# Patient Record
Sex: Male | Born: 1965 | Race: White | Hispanic: No | Marital: Married | Smoking: Former smoker
Health system: Southern US, Community
[De-identification: ages and names within clinical notes are randomized; demographics above are authoritative.]

## PROBLEM LIST (undated history)

## (undated) DIAGNOSIS — G4730 Sleep apnea, unspecified: Secondary | ICD-10-CM

## (undated) DIAGNOSIS — E119 Type 2 diabetes mellitus without complications: Secondary | ICD-10-CM

## (undated) DIAGNOSIS — E559 Vitamin D deficiency, unspecified: Secondary | ICD-10-CM

## (undated) DIAGNOSIS — M1990 Unspecified osteoarthritis, unspecified site: Secondary | ICD-10-CM

## (undated) DIAGNOSIS — K219 Gastro-esophageal reflux disease without esophagitis: Secondary | ICD-10-CM

## (undated) HISTORY — DX: Vitamin D deficiency, unspecified: E55.9

## (undated) HISTORY — DX: Unspecified osteoarthritis, unspecified site: M19.90

## (undated) HISTORY — PX: ELBOW SURGERY: SHX618

## (undated) HISTORY — PX: HERNIA REPAIR: SHX51

## (undated) HISTORY — DX: Gastro-esophageal reflux disease without esophagitis: K21.9

## (undated) HISTORY — PX: SPINE SURGERY: SHX786

## (undated) HISTORY — PX: FRACTURE SURGERY: SHX138

---

## 1999-02-14 ENCOUNTER — Ambulatory Visit: Admission: RE | Admit: 1999-02-14 | Discharge: 1999-02-14 | Payer: Self-pay | Admitting: *Deleted

## 1999-02-14 ENCOUNTER — Encounter: Payer: Self-pay | Admitting: *Deleted

## 2012-04-27 ENCOUNTER — Emergency Department
Admit: 2012-04-27 | Discharge: 2012-04-27 | Disposition: A | Discharge status: Home / Self Care | Payer: BC Managed Care – PPO

## 2012-04-27 ENCOUNTER — Emergency Department
Admission: EM | Admit: 2012-04-27 | Discharge: 2012-04-27 | Disposition: A | Discharge status: Home / Self Care | Payer: BC Managed Care – PPO | Source: Home / Self Care

## 2012-04-27 DIAGNOSIS — M533 Sacrococcygeal disorders, not elsewhere classified: Secondary | ICD-10-CM

## 2012-04-27 LAB — POCT URINALYSIS DIP (MANUAL ENTRY)
Bilirubin, UA: NEGATIVE
Blood, UA: NEGATIVE
Glucose, UA: NEGATIVE
Ketones, POC UA: NEGATIVE
Leukocytes, UA: NEGATIVE
Nitrite, UA: NEGATIVE
Protein Ur, POC: NEGATIVE
Spec Grav, UA: 1.025 (ref 1.005–1.03)
Urobilinogen, UA: 0.2 (ref 0–1)
pH, UA: 7 (ref 5–8)

## 2012-04-27 MED ORDER — MELOXICAM 15 MG PO TABS: 15.0000 mg | ORAL_TABLET | Freq: Every day | ORAL | Status: DC

## 2012-04-27 NOTE — ED Notes (Addendum)
Patient complains of lower back pain for 4 weeks. Bobby Saunders states the pain is worse when he is walking and sometimes the pain radiates down his legs. He describes the pain as severe shooting pain that is a 6/10 on the pain scale. Bobby Saunders also states when his bladder is full he feels more pain in his lower back.

## 2012-04-27 NOTE — ED Provider Notes (Signed)
History     CSN: [id number]  Arrival date & time 04/27/12  1238   None     Chief Complaint  Patient presents with  . Back Pain    4 weeks      HPI Comments: Patient complains of one month history of intermittent recurring bilateral low back pain.  He points to two areas that are over the SI joints.  The pain does not radiate, although he experiences a vague weakness in the knees when the pain occurs.  The pain is not constant, and when it occurs it is a brief sharp "catch" in his lower back.  The pain is worse when standing and walking, and relieved by sitting/supine.  Over the past 3 days the pain has been somewhat worse.  No bowel or bladder dysfunction.  No saddle numbness.  He notes that with any valsalva maneuver there is discomfort in the lower back.  He feels well otherwise.  No fevers, chills, and sweats  He recalls no injury.  He works for [employer] and is constantly walking and carrying packages.  No recent change in work activities.  He admits that he has gained about 50 pounds over the past several years.  Patient is a 46 y.o. male presenting with back pain. The history is provided by the patient.  Back Pain  This is a new problem. Episode onset: 1 month ago. Episode frequency: intermittently. The problem has been rapidly worsening. The pain is associated with no known injury. The pain is present in the sacro-iliac joint. The quality of the pain is described as stabbing. The pain radiates to the right knee and left knee. The pain is at a severity of 6/10. The pain is moderate. Exacerbated by: standing. The pain is worse during the day. Pertinent negatives include no fever, no numbness, no weight loss, no abdominal pain, no bowel incontinence, no perianal numbness, no bladder incontinence, no dysuria, no pelvic pain, no leg pain, no paresthesias, no paresis, no tingling and no weakness. He has tried NSAIDs for the symptoms. The treatment provided mild relief. Risk factors include obesity.     History reviewed. No pertinent past medical history.  Past Surgical History  Procedure Date  . Elbow surgery     Family History  Problem Relation Age of Onset  . Cancer Sister     colon    History  Substance Use Topics  . Smoking status: Former Smoker -- 1.0 packs/day for 25 years  . Smokeless tobacco: Current User  . Alcohol Use: No      Review of Systems  Constitutional: Negative for fever and weight loss.  Gastrointestinal: Negative for abdominal pain and bowel incontinence.  Genitourinary: Negative for bladder incontinence, dysuria and pelvic pain.  Musculoskeletal: Positive for back pain.  Neurological: Negative for tingling, weakness, numbness and paresthesias.  All other systems reviewed and are negative.    Allergies  Review of patient's allergies indicates no known allergies.  Home Medications   Current Outpatient Rx  Name Route Sig Dispense Refill  . MELOXICAM 15 MG PO TABS Oral Take 1 tablet (15 mg total) by mouth daily. Take with food each morning 15 tablet 1    BP 118/74  Pulse 60  Temp(Src) 98.3 F (36.8 C) (Oral)  Resp 18  Ht 5\' 10"  (1.778 m)  Wt 277 lb (125.646 kg)  BMI 39.75 kg/m2  SpO2 95%  Physical Exam Nursing notes and Vital Signs reviewed. Appearance:  Patient appears stated age, and in no  acute distress.  Patient is obese (BMI 39.8) Eyes:  Pupils are equal, round, and reactive to light and accomodation.  Extraocular movement is intact.  Conjunctivae are not inflamed  Neck:  full range of motion   Lungs:  Clear to auscultation.  Breath sounds are equal.  Heart:  Regular rate and rhythm without murmurs, rubs, or gallops.  Abdomen:  Nontender without masses or hepatosplenomegaly.  Bowel sounds are present.  No CVA or flank tenderness.  Extremities:  No edema.  No calf tenderness.  No tenderness over greater trochanters.  No leg length discrepancy Skin:  No rash present.   Back:  Full range of motion.  Can heel/toe walk and squat  without difficulty.  No tenderness to palpation.   Straight leg raising test is negative.  Sitting knee extension test is negative.  Strength and sensation in the lower extremities is normal.  Patellar and achilles reflexes are normal.  Areas of pain are located over SI joints but no tenderness there.    ED Course  Procedures  none  Labs Reviewed - Urinalysis (dipstick): negative Dg Lumbar Spine 2-3 Views  04/27/2012  *RADIOLOGY REPORT*  Clinical Data: Lower back pain for 1 month.  Pain over the SI joints.  No known injury.  LUMBAR SPINE - 2-3 VIEW  Comparison: None.  Findings: There are five non-rib bearing vertebral bodies.  There is normal alignment.  There is no evidence for acute fracture or subluxation.   No significant degenerative changes identified. The visualized portion of the pelvis has a normal appearance. Visualized bowel gas pattern is nonobstructive.  IMPRESSION: Negative exam.  Original Report Authenticated By: Patterson Hammersmith, M.D.     1. Pain of both sacroiliac joints, probably exacerbated by weight gain      MDM  Begin Mobic 15mg  QAM pc.  Begin applying ice pack 2 or 3 times daily.  Begin stretching and range of motion exercises ([facility] information and instruction handout given)  Discussed long term goal of gradual weight loss. Followup with [facility] if not improving about two weeks.         Lattie Haw, MD 04/27/12 1425

## 2012-04-27 NOTE — Discharge Instructions (Signed)
Apply ice pack two or three times daily.  Begin back exercises.  Sacroiliac Joint Dysfunction The sacroiliac joint connects the lower part of the spine (the sacrum) with the bones of the pelvis. CAUSES  Sometimes, there is no obvious reason for sacroiliac joint dysfunction. Other times, it may occur   During pregnancy.   After injury, such as:   Car accidents.   Sport-related injuries.   Work-related injuries.   Due to one leg being shorter than the other.   Due to other conditions that affect the joints, such as:   Rheumatoid arthritis.   Gout.   Psoriasis.   Joint infection (septic arthritis).  SYMPTOMS  Symptoms may include:  Pain in the:   Lower back.   Buttocks.   Groin.   Thighs and legs.   Difficult sitting, standing, walking, lying, bending or lifting.  DIAGNOSIS  A number of tests may be used to help diagnose the cause of sacroiliac joint dysfunction, including:  Imaging tests to look for other causes of pain, including:   MRI.   CT scan.   Bone scan.   Diagnostic injection: During a special x-ray (called fluoroscopy), a needle is put into the sacroiliac joint. A numbing medicine is injected into the joint. If the pain is improved or stopped, the diagnosis of sacroiliac joint dysfunction is more likely.  TREATMENT  There are a number of types of treatment used for sacroiliac joint dysfunction, including:  Only take over-the-counter or prescription medicines for pain, discomfort, or fever as directed by your caregiver.   Medications to relax muscles.   Rest. Decreasing activity can help cut down on painful muscle spasms and allow the back to heal.   Application of heat or ice to the lower back may improve muscle spasms and soothe pain.   Brace. A special back brace, called a sacroiliac belt, can help support the joint while your back is healing.   Physical therapy can help teach comfortable positions and exercises to strengthen muscles that  support the sacroiliac joint.   Cortisone injections. Injections of steroid medicine into the joint can help decrease swelling and improve pain.   Hyaluronic acid injections. This chemical improves lubrication within the sacroiliac joint, thereby decreasing pain.   Radiofrequency ablation. A special needle is placed into the joint, where it burns away nerves that are carrying pain messages from the joint.   Surgery. Because pain occurs during movement of the joint, screws and plates may be installed in order to limit or prevent joint motion.  HOME CARE INSTRUCTIONS   Take all medications exactly as directed.   Follow instructions regarding both rest and physical activity, to avoid worsening the pain.   Do physical therapy exercises exactly as prescribed.  SEEK IMMEDIATE MEDICAL CARE IF:  You experience increasingly severe pain.   You develop new symptoms, such as numbness or tingling in your legs or feet.   You lose bladder or bowel control.  Document Released: 02/15/2009 Document Revised: 11/08/2011 Document Reviewed: 02/15/2009 [facility] Patient Information 2012 [place], [place].

## 2012-05-30 ENCOUNTER — Encounter: Payer: Self-pay | Admitting: Family Medicine

## 2012-05-30 ENCOUNTER — Ambulatory Visit (INDEPENDENT_AMBULATORY_CARE_PROVIDER_SITE_OTHER): Payer: BC Managed Care – PPO | Admitting: Family Medicine

## 2012-05-30 VITALS — BP 125/81 | HR 58 | Temp 98.1°F | Ht 70.0 in | Wt 270.0 lb

## 2012-05-30 DIAGNOSIS — M545 Low back pain, unspecified: Secondary | ICD-10-CM

## 2012-05-30 DIAGNOSIS — M79604 Pain in right leg: Secondary | ICD-10-CM

## 2012-05-30 MED ORDER — PREDNISONE (PAK) 10 MG PO TABS: ORAL_TABLET | ORAL | Status: DC

## 2012-05-30 MED ORDER — MELOXICAM 15 MG PO TABS: 15.0000 mg | ORAL_TABLET | Freq: Every day | ORAL | Status: DC

## 2012-05-30 NOTE — Patient Instructions (Addendum)
I'm concerned you have spinal stenosis as a result of disc herniation/bulging. Take tylenol for baseline pain relief (1-2 extra strength tabs 3x/day) Start prednisone as directed.   Stop meloxicam for now but restart this the day after you finish prednisone. Consider Tramadol or vicodin as needed for severe pain (no driving on this medicine). Consider Flexeril as needed for muscle spasms (no driving on this medicine). Stay as active as possible. Physical therapy has been shown to be helpful as well - start this in [place]. Strengthening of low back muscles, abdominal musculature are key for long term pain relief in most cases. If not improving, will consider further imaging (MRI). Follow up with me in 2 weeks if you are not improving - otherwise follow-up with me in 6 weeks.

## 2012-06-02 ENCOUNTER — Encounter: Payer: Self-pay | Admitting: Family Medicine

## 2012-06-02 DIAGNOSIS — M545 Low back pain, unspecified: Secondary | ICD-10-CM | POA: Insufficient documentation

## 2012-06-02 NOTE — Assessment & Plan Note (Signed)
Patient's description of pain in low back radiating into legs, improved with leaning forward is suggestive of spinal stenosis likely from disc bulge/herniation given lumbar radiographs appear normal.  Will try to treat conservative - start prednisone and transition back to meloxicam.  Tylenol as needed in addition to this. Start PT as well.  F/u in 2 weeks if not improving - would consider MRI at that time.  Otherwise f/u in 6 weeks.

## 2012-06-02 NOTE — Progress Notes (Addendum)
  Subjective:    Patient ID: Bobby Saunders, male    DOB: 08-25-1966, 46 y.o.   MRN: [id number]  PCP: Albertina Parr  HPI 46 yo M here for low back, leg pain.  Patient denies known injury. States about 2 months ago he started to have low back pain that was worse with standing still, by end of day, and at rest. Improved leaning forward and moving and sitting. Tingling goes into both feet at times. Had x-rays done which were negative. No bowel or bladder dysfunction, no leg weakness. No prior back issues. No FH DM, RA. Taking meloxicam. 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 Pain shoots when walking down to posterior knees.  History reviewed. No pertinent past medical history.  No current outpatient prescriptions on file prior to visit.    Past Surgical History  Procedure Date  . Elbow surgery     No Known Allergies  History   Social History  . Marital Status: Married    Spouse Name: N/A    Number of Children: N/A  . Years of Education: N/A   Occupational History  . Not on file.   Social History Main Topics  . Smoking status: Former Smoker -- 1.0 packs/day for 25 years  . Smokeless tobacco: Current User  . Alcohol Use: No  . Drug Use: No  . Sexually Active: Not on file   Other Topics Concern  . Not on file   Social History Narrative  . No narrative on file    Family History  Problem Relation Age of Onset  . Cancer Sister     colon  . Sudden death Neg Hx   . Hypertension Neg Hx   . Hyperlipidemia Neg Hx   . Heart attack Neg Hx   . Diabetes Neg Hx     BP 125/81  Pulse 58  Temp 98.1 F (36.7 C) (Oral)  Ht 5\' 10"  (1.778 m)  Wt 270 lb (122.471 kg)  BMI 38.74 kg/m2  Review of Systems See HPI above.    Objective:   Physical Exam Gen: NAD  Back: No gross deformity, scoliosis. TTP mildly bilateral paraspinal regions lumbar spine and into buttocks.  No midline or bony TTP. FROM with pain on flexion but not extension. Strength LEs 5/5  all muscle groups.   2+ MSRs in patellar and achilles tendons, equal bilaterally. Negative SLRs - pulling in hamstring area only. Sensation intact to light touch bilaterally. Negative logroll bilateral hips Negative fabers and piriformis stretches.     Assessment & Plan:  1. Low back pain - Patient's description of pain in low back radiating into legs, improved with leaning forward is suggestive of spinal stenosis likely from disc bulge/herniation given lumbar radiographs appear normal.  Will try to treat conservative - start prednisone and transition back to meloxicam.  Tylenol as needed in addition to this. Start PT as well.  F/u in 2 weeks if not improving - would consider MRI at that time.  Otherwise f/u in 6 weeks.

## 2012-06-06 ENCOUNTER — Ambulatory Visit: Payer: BC Managed Care – PPO | Attending: Family Medicine | Admitting: Physical Therapy

## 2012-06-06 DIAGNOSIS — IMO0001 Reserved for inherently not codable concepts without codable children: Secondary | ICD-10-CM | POA: Insufficient documentation

## 2012-06-06 DIAGNOSIS — M545 Low back pain, unspecified: Secondary | ICD-10-CM | POA: Insufficient documentation

## 2012-06-06 DIAGNOSIS — M25569 Pain in unspecified knee: Secondary | ICD-10-CM | POA: Insufficient documentation

## 2012-06-10 ENCOUNTER — Encounter: Payer: BC Managed Care – PPO | Admitting: Physical Therapy

## 2012-06-13 ENCOUNTER — Encounter: Payer: BC Managed Care – PPO | Admitting: Physical Therapy

## 2012-06-17 ENCOUNTER — Ambulatory Visit: Payer: BC Managed Care – PPO | Admitting: Physical Therapy

## 2012-06-17 ENCOUNTER — Encounter: Payer: BC Managed Care – PPO | Admitting: Physical Therapy

## 2012-06-18 ENCOUNTER — Encounter: Payer: BC Managed Care – PPO | Admitting: Physical Therapy

## 2012-06-25 ENCOUNTER — Ambulatory Visit: Payer: BC Managed Care – PPO | Admitting: Physical Therapy

## 2012-07-02 ENCOUNTER — Ambulatory Visit: Payer: BC Managed Care – PPO | Admitting: Physical Therapy

## 2012-07-09 ENCOUNTER — Ambulatory Visit: Payer: BC Managed Care – PPO | Attending: Family Medicine | Admitting: Physical Therapy

## 2012-07-09 DIAGNOSIS — M545 Low back pain, unspecified: Secondary | ICD-10-CM | POA: Insufficient documentation

## 2012-07-09 DIAGNOSIS — IMO0001 Reserved for inherently not codable concepts without codable children: Secondary | ICD-10-CM | POA: Insufficient documentation

## 2012-07-09 DIAGNOSIS — M25569 Pain in unspecified knee: Secondary | ICD-10-CM | POA: Insufficient documentation

## 2012-07-16 ENCOUNTER — Ambulatory Visit: Payer: BC Managed Care – PPO | Admitting: Physical Therapy

## 2012-07-24 ENCOUNTER — Ambulatory Visit: Payer: BC Managed Care – PPO | Admitting: Family Medicine

## 2012-07-28 ENCOUNTER — Ambulatory Visit (INDEPENDENT_AMBULATORY_CARE_PROVIDER_SITE_OTHER): Payer: BC Managed Care – PPO | Admitting: Family Medicine

## 2012-07-28 ENCOUNTER — Encounter: Payer: Self-pay | Admitting: Family Medicine

## 2012-07-28 VITALS — BP 125/80 | HR 57 | Ht 70.0 in | Wt 275.0 lb

## 2012-07-28 DIAGNOSIS — M545 Low back pain, unspecified: Secondary | ICD-10-CM

## 2012-07-28 NOTE — Progress Notes (Signed)
Subjective:    Patient ID: Bobby Saunders, male    DOB: 1966-02-11, 46 y.o.   MRN: [id number]  PCP: Albertina Parr  HPI  46 yo M here for f/u low back, leg pain.  6/28: Patient denies known injury. States about 2 months ago he started to have low back pain that was worse with standing still, by end of day, and at rest. Improved leaning forward and moving and sitting. Tingling goes into both feet at times. Had x-rays done which were negative. No bowel or bladder dysfunction, no leg weakness. No prior back issues. No FH DM, RA. Taking meloxicam. 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 Pain shoots when walking down to posterior knees.  8/26: Patient reports he is about 46% improved from last visit but still has quite a bit of pain. Pain primarily when getting up in morning and at end of day after work - improves while working. Continues to have radiation down both legs posteriorly at times. Taking meloxicam. Finished prednisone - a little improvement with this but seems most improvement has been with PT/HEP. Pain feels like a tazer in low back when this comes on - feels locked at beginning of movements. No other changes.   History reviewed. No pertinent past medical history.  Current Outpatient Prescriptions on File Prior to Visit  Medication Sig Dispense Refill  . meloxicam (MOBIC) 15 MG tablet Take 1 tablet (15 mg total) by mouth daily. Take with food each morning.  Start AFTER finishing prednisone.  30 tablet  1    Past Surgical History  Procedure Date  . Elbow surgery     No Known Allergies  History   Social History  . Marital Status: Married    Spouse Name: N/A    Number of Children: N/A  . Years of Education: N/A   Occupational History  . Not on file.   Social History Main Topics  . Smoking status: Former Smoker -- 1.0 packs/day for 25 years  . Smokeless tobacco: Current User  . Alcohol Use: No  . Drug Use: No  . Sexually Active: Not on file    Other Topics Concern  . Not on file   Social History Narrative  . No narrative on file    Family History  Problem Relation Age of Onset  . Cancer Sister     colon  . Sudden death Neg Hx   . Hypertension Neg Hx   . Hyperlipidemia Neg Hx   . Heart attack Neg Hx   . Diabetes Neg Hx     BP 125/80  Pulse 57  Ht 5\' 10"  (1.778 m)  Wt 275 lb (124.739 kg)  BMI 39.46 kg/m2  Review of Systems  See HPI above.    Objective:   Physical Exam  Gen: NAD  Back: No gross deformity, scoliosis. No TTP bilateral paraspinal regions lumbar spine or buttocks, improved.  No midline or bony TTP. FROM without pain. Strength LEs 5/5 all muscle groups.   2+ MSRs in patellar and achilles tendons, equal bilaterally. Negative SLRs. Sensation intact to light touch bilaterally. Negative logroll bilateral hips Negative fabers and piriformis stretches.    Assessment & Plan:  1. Low back pain - Felt to be due to spinal stenosis, disc bulge/herniation.  Radiographs normal.  Has had some improvement but pain still debilitating at times.  Will go ahead with MRI lumbar spine to further assess.  Consider ESIs, neurosurg referral, continued PT/HEP depending on results.  Addendum 8/28:  MRI confirms moderate spinal stenosis at L3-4 with protrusion on right that could affect L3 or 4 nerve roots on right.  Think most of his symptoms are due to spinal stenosis.  Will refer patient to Dr. Maurice Small to consider injections.  Do not think his findings are severe enough to warrant neurosurgery referral at this time.

## 2012-07-28 NOTE — Assessment & Plan Note (Signed)
Felt to be due to spinal stenosis, disc bulge/herniation.  Radiographs normal.  Has had some improvement but pain still debilitating at times.  Will go ahead with MRI lumbar spine to further assess.  Consider ESIs, neurosurg referral, continued PT/HEP depending on results.

## 2012-07-29 ENCOUNTER — Ambulatory Visit (HOSPITAL_BASED_OUTPATIENT_CLINIC_OR_DEPARTMENT_OTHER)
Admission: RE | Admit: 2012-07-29 | Discharge: 2012-07-29 | Disposition: A | Discharge status: Home / Self Care | Payer: BC Managed Care – PPO | Source: Ambulatory Visit | Attending: Family Medicine | Admitting: Family Medicine

## 2012-07-29 DIAGNOSIS — M5126 Other intervertebral disc displacement, lumbar region: Secondary | ICD-10-CM | POA: Insufficient documentation

## 2012-07-29 DIAGNOSIS — M25559 Pain in unspecified hip: Secondary | ICD-10-CM | POA: Insufficient documentation

## 2012-07-29 DIAGNOSIS — M545 Low back pain, unspecified: Secondary | ICD-10-CM

## 2012-11-03 ENCOUNTER — Encounter: Payer: Self-pay | Admitting: Sports Medicine

## 2012-11-03 ENCOUNTER — Ambulatory Visit (INDEPENDENT_AMBULATORY_CARE_PROVIDER_SITE_OTHER): Payer: BC Managed Care – PPO | Admitting: Sports Medicine

## 2012-11-03 VITALS — BP 138/85 | HR 56 | Wt 278.0 lb

## 2012-11-03 DIAGNOSIS — Z299 Encounter for prophylactic measures, unspecified: Secondary | ICD-10-CM

## 2012-11-03 DIAGNOSIS — Z23 Encounter for immunization: Secondary | ICD-10-CM

## 2012-11-03 DIAGNOSIS — M545 Low back pain, unspecified: Secondary | ICD-10-CM

## 2012-11-03 DIAGNOSIS — Z0000 Encounter for general adult medical examination without abnormal findings: Secondary | ICD-10-CM | POA: Insufficient documentation

## 2012-11-03 LAB — CBC
HCT: 49.2 % (ref 39.0–52.0)
Hemoglobin: 17.6 g/dL — ABNORMAL HIGH (ref 13.0–17.0)
MCH: 31.8 pg (ref 26.0–34.0)
MCHC: 35.8 g/dL (ref 30.0–36.0)
MCV: 88.8 fL (ref 78.0–100.0)
Platelets: 226 K/uL (ref 150–400)
RBC: 5.54 MIL/uL (ref 4.22–5.81)
RDW: 13.6 % (ref 11.5–15.5)
WBC: 8.8 10*3/uL (ref 4.0–10.5)

## 2012-11-03 LAB — COMPREHENSIVE METABOLIC PANEL
ALT: 26 U/L (ref 0–53)
BUN: 13 mg/dL (ref 6–23)
CO2: 28 mEq/L (ref 19–32)
Calcium: 9.9 mg/dL (ref 8.4–10.5)
Chloride: 103 mEq/L (ref 96–112)
Creat: 1.2 mg/dL (ref 0.50–1.35)
Total Bilirubin: 0.5 mg/dL (ref 0.3–1.2)

## 2012-11-03 LAB — COMPREHENSIVE METABOLIC PANEL WITH GFR
AST: 19 U/L (ref 0–37)
Albumin: 4.6 g/dL (ref 3.5–5.2)
Alkaline Phosphatase: 45 U/L (ref 39–117)
Glucose, Bld: 86 mg/dL (ref 70–99)
Potassium: 4.6 meq/L (ref 3.5–5.3)
Sodium: 143 meq/L (ref 135–145)
Total Protein: 6.8 g/dL (ref 6.0–8.3)

## 2012-11-03 LAB — LIPID PANEL
Cholesterol: 228 mg/dL — ABNORMAL HIGH (ref 0–200)
HDL: 43 mg/dL (ref >39)
LDL Cholesterol: 157 mg/dL — ABNORMAL HIGH (ref 0–99)
Total CHOL/HDL Ratio: 5.3 ratio
Triglycerides: 138 mg/dL (ref <150)
VLDL: 28 mg/dL (ref 0–40)

## 2012-11-03 NOTE — Assessment & Plan Note (Addendum)
Spinal stenosis most predominant at the L3-L4 level. He did have symptoms of a left lumbar radiculopathy likely originating from this level. Epidural steroid injections did give him significant pain relief, these were done at [facility] orth[facility]. He has already been through 6 weeks of formal physical therapy. At this point, I would like him to see Dr. Estill Bamberg [facility] for consideration of surgical intervention. I think he would benefit from custom orthotics with a heel lift. He will come back for these.

## 2012-11-03 NOTE — Progress Notes (Signed)
Subjective:    CC: Establish care.   HPI:  Low back pain: This is been present for many years, pain is localized in the low back, it did radiate down his left leg, but this is improved after injections.  The pain is worse with walking without footwear, but improved significantly with wearing shoes. Worse with the first couple of degrees of flexion, but further flexion relieves his pain.  He has already been to formal physical therapy for 6 weeks, MRI, as well as interventional injections.  Overall he reports his symptoms are improved, and he would consider surgery.  Past medical history, Surgical history, Family history, Social history, Allergies, and medications have been entered into the medical record, reviewed, and no changes needed.   Review of Systems: No headache, visual changes, nausea, vomiting, diarrhea, constipation, dizziness, abdominal pain, skin rash, fevers, chills, night sweats, swollen lymph nodes, weight loss, chest pain, body aches, joint swelling, muscle aches, shortness of breath, mood changes, visual or auditory hallucinations.  Objective:    General: Well Developed, well nourished, and in no acute distress.  Neuro: Alert and oriented x3, extra-ocular muscles intact.  HEENT: Normocephalic, atraumatic, pupils equal round reactive to light, neck supple, no masses, no lymphadenopathy, thyroid nonpalpable.  Skin: Warm and dry, no rashes noted.  Cardiac: Regular rate and rhythm, no murmurs rubs or gallops.  Respiratory: Clear to auscultation bilaterally. Not using accessory muscles, speaking in full sentences.  Abdominal: Soft, nontender, nondistended, positive bowel sounds, no masses, no organomegaly.  Back Exam:  Inspection: Unremarkable  Motion: Flexion 45 deg, Extension 45 deg, Side Bending to 45 deg bilaterally,  Rotation to 45 deg bilaterally  SLR laying: Reproduces pain in back.  XSLR laying: Negative  Palpable tenderness: None. FABER: negative. Sensory change:  Gross sensation intact to all lumbar and sacral dermatomes.  Reflexes: 2+ at both patellar tendons, 2+ at achilles tendons, Babinski's downgoing.  Strength at foot  Plantar-flexion: 5/5 Dorsi-flexion: 5/5 Eversion: 5/5 Inversion: 5/5  Leg strength  Quad: 5/5 Hamstring: 5/5 Hip flexor: 5/5 Hip abductors: 5/5  Gait unremarkable.  I did review his MRI, the major finding is a broad-based disc protrusion at the L3-L4 level causing bilateral foraminal stenosis as well as spinal stenosis.  She did have 2 mm of posterior slope at the L3-L4 level, but he has not had flexion extension radiographs.  Impression and Recommendations:    The patient was counselled, risk factors were discussed, anticipatory guidance given.

## 2012-11-03 NOTE — Assessment & Plan Note (Addendum)
Patient gets colonoscopies every 10 years, this is for a strong family history of colon cancer. He does desire that we check PSA. We'll also check lipid panel, CMET, CBC, testosterone Tdap and influenza vaccines given today

## 2012-11-04 LAB — PSA, TOTAL AND FREE
PSA, Free Pct: 30 % (ref >25)
PSA, Free: 0.18 ng/mL
PSA: 0.6 ng/mL (ref <=4.00)

## 2012-11-06 ENCOUNTER — Other Ambulatory Visit: Payer: Self-pay | Admitting: *Deleted

## 2012-11-06 LAB — TESTOSTERONE, FREE, TOTAL, SHBG
Sex Hormone Binding: 27 nmol/L (ref 13–71)
Testosterone, Free: 58.1 pg/mL (ref 47.0–244.0)
Testosterone-% Free: 2.2 % (ref 1.6–2.9)
Testosterone: 267 ng/dL (ref 241–827)

## 2012-11-06 MED ORDER — ATORVASTATIN CALCIUM 40 MG PO TABS: 40.0000 mg | ORAL_TABLET | Freq: Every day | ORAL | Status: DC

## 2012-11-06 NOTE — Telephone Encounter (Signed)
Medication for cholesterol sent to pharmacy.

## 2012-12-18 ENCOUNTER — Ambulatory Visit (INDEPENDENT_AMBULATORY_CARE_PROVIDER_SITE_OTHER): Payer: BC Managed Care – PPO | Admitting: Sports Medicine

## 2012-12-18 ENCOUNTER — Encounter: Payer: Self-pay | Admitting: Sports Medicine

## 2012-12-18 VITALS — BP 130/79 | HR 60 | Wt 279.0 lb

## 2012-12-18 DIAGNOSIS — E291 Testicular hypofunction: Secondary | ICD-10-CM

## 2012-12-18 DIAGNOSIS — Z299 Encounter for prophylactic measures, unspecified: Secondary | ICD-10-CM

## 2012-12-18 DIAGNOSIS — M545 Low back pain, unspecified: Secondary | ICD-10-CM

## 2012-12-18 DIAGNOSIS — R269 Unspecified abnormalities of gait and mobility: Secondary | ICD-10-CM

## 2012-12-18 DIAGNOSIS — E785 Hyperlipidemia, unspecified: Secondary | ICD-10-CM

## 2012-12-18 LAB — LIPID PANEL
Cholesterol: 144 mg/dL (ref 0–200)
HDL: 41 mg/dL (ref >39)
LDL Cholesterol: 87 mg/dL (ref 0–99)
Total CHOL/HDL Ratio: 3.5 Ratio
Triglycerides: 81 mg/dL (ref <150)
VLDL: 16 mg/dL (ref 0–40)

## 2012-12-18 NOTE — Assessment & Plan Note (Signed)
Continue Lipitor. Rechecking lipids.

## 2012-12-18 NOTE — Assessment & Plan Note (Signed)
Symptoms overall improved. We will defer visit with spine surgeon. Orthotics as above. Return to clinic on an as needed basis for this.

## 2012-12-18 NOTE — Assessment & Plan Note (Signed)
Asymptomatic. We will consider treating this if he develops symptoms.

## 2012-12-18 NOTE — Progress Notes (Signed)
Subjective:    CC: Followup and orthotics.  HPI: Low back pain with spinal stenosis as well as left lumbar radiculitis of the L3-L4 level: Symptoms are overall resolved, we did discuss custom orthotics as he's had these in the past and it worked well.  Hyperlipidemia: Is on Lipitor, due for recheck.  Hypogonadism: Currently asymptomatic.  Past medical history, Surgical history, Family history not pertinant except as noted below, Social history, Allergies, and medications have been entered into the medical record, reviewed, and no changes needed.   Review of Systems: No fevers, chills, night sweats, weight loss, chest pain, or shortness of breath.   Objective:    General: Well Developed, well nourished, and in no acute distress.  Neuro: Alert and oriented x3, extra-ocular muscles intact, sensation grossly intact.  HEENT: Normocephalic, atraumatic, pupils equal round reactive to light, neck supple, no masses, no lymphadenopathy, thyroid nonpalpable.  Skin: Warm and dry, no rashes. Cardiac: Regular rate and rhythm, no murmurs rubs or gallops.  Respiratory: Clear to auscultation bilaterally. Not using accessory muscles, speaking in full sentences.  Patient was fitted for a : standard, cushioned, semi-rigid orthotic. The orthotic was heated and afterward the patient stood on the orthotic blank positioned on the orthotic stand. The patient was positioned in subtalar neutral position and 10 degrees of ankle dorsiflexion in a weight bearing stance. After completion of molding, a stable base was applied to the orthotic blank. The blank was ground to a stable position for weight bearing. Size: 11 Base: Blue EVA Additional Posting and Padding: None The patient ambulated these, and they were very comfortable.  I spent over 40 minutes with this patient, greater than 50% was face-to-face time counseling. This counseling was regarding hyperlipidemia, preventive measures, and low back  pain.  Impression and Recommendations:

## 2012-12-18 NOTE — Assessment & Plan Note (Signed)
Recheck lipids

## 2013-01-14 ENCOUNTER — Other Ambulatory Visit: Payer: Self-pay | Admitting: Sports Medicine

## 2013-01-29 ENCOUNTER — Ambulatory Visit (INDEPENDENT_AMBULATORY_CARE_PROVIDER_SITE_OTHER): Payer: BC Managed Care – PPO | Admitting: Sports Medicine

## 2013-01-29 VITALS — BP 141/88 | HR 64 | Wt 279.0 lb

## 2013-01-29 DIAGNOSIS — M545 Low back pain, unspecified: Secondary | ICD-10-CM

## 2013-01-29 DIAGNOSIS — E785 Hyperlipidemia, unspecified: Secondary | ICD-10-CM

## 2013-01-29 DIAGNOSIS — R030 Elevated blood-pressure reading, without diagnosis of hypertension: Secondary | ICD-10-CM

## 2013-01-29 DIAGNOSIS — L709 Acne, unspecified: Secondary | ICD-10-CM | POA: Insufficient documentation

## 2013-01-29 DIAGNOSIS — L918 Other hypertrophic disorders of the skin: Secondary | ICD-10-CM | POA: Insufficient documentation

## 2013-01-29 DIAGNOSIS — R21 Rash and other nonspecific skin eruption: Secondary | ICD-10-CM

## 2013-01-29 DIAGNOSIS — L708 Other acne: Secondary | ICD-10-CM

## 2013-01-29 MED ORDER — TRIAMCINOLONE ACETONIDE 0.5 % EX CREA: TOPICAL_CREAM | Freq: Two times a day (BID) | CUTANEOUS | Status: DC

## 2013-01-29 MED ORDER — MINOCYCLINE HCL 100 MG PO CAPS: 100.0000 mg | ORAL_CAPSULE | Freq: Every day | ORAL | Status: DC

## 2013-01-29 NOTE — Assessment & Plan Note (Signed)
Appears to be hives on both forearms. Kenalog cream. Return as needed.

## 2013-01-29 NOTE — Progress Notes (Signed)
Subjective:    CC: Followup  HPI: Skin rash: Present for a few days, pruritic, he is not changed detergents, lotions, oils, has not had any new foods, has had slightly more sun exposure than usual.  Low back pain: Previous epidural injection was done at [facility] bilaterally at the L4-L5 level.  Doesn't feel severe thoughts of help that much, but does feel they would if he had a little bit more arch support.  Acne: Has been prescribed minocycline by another doctor, this is localized over the top of his head, would like a refill as this worked very well.  Hypertension:  Past medical history, Surgical history, Family history not pertinant except as noted below, Social history, Allergies, and medications have been entered into the medical record, reviewed, and no changes needed.   Review of Systems: No fevers, chills, night sweats, weight loss, chest pain, or shortness of breath.   Objective:    General: Well Developed, well nourished, and in no acute distress.  Neuro: Alert and oriented x3, extra-ocular muscles intact, sensation grossly intact.  HEENT: Normocephalic, atraumatic, pupils equal round reactive to light, neck supple, no masses, no lymphadenopathy, thyroid nonpalpable.  Skin: Warm and dry, no rashes. Cardiac: Regular rate and rhythm, no murmurs rubs or gallops.  Respiratory: Clear to auscultation bilaterally. Not using accessory muscles, speaking in full sentences. Impression and Recommendations:

## 2013-01-29 NOTE — Assessment & Plan Note (Signed)
We did build him some custom orthotics in the past. These are comfortable he does desire additional arch support. I added scaphoid pads which are quite comfortable. He does desire an additional epidural injection. I will set this up at [facility] imaging, bilateral L4-L5 transforaminal.

## 2013-01-29 NOTE — Assessment & Plan Note (Signed)
Has been controlled on previous visits. We will just watch this for now.

## 2013-01-29 NOTE — Assessment & Plan Note (Signed)
Refilling minocycline.

## 2013-03-11 ENCOUNTER — Ambulatory Visit (INDEPENDENT_AMBULATORY_CARE_PROVIDER_SITE_OTHER): Payer: BC Managed Care – PPO | Admitting: Family Medicine

## 2013-03-11 ENCOUNTER — Encounter: Payer: Self-pay | Admitting: Family Medicine

## 2013-03-11 VITALS — BP 120/72 | HR 55 | Wt 282.0 lb

## 2013-03-11 DIAGNOSIS — L255 Unspecified contact dermatitis due to plants, except food: Secondary | ICD-10-CM

## 2013-03-11 DIAGNOSIS — J309 Allergic rhinitis, unspecified: Secondary | ICD-10-CM

## 2013-03-11 DIAGNOSIS — J302 Other seasonal allergic rhinitis: Secondary | ICD-10-CM

## 2013-03-11 MED ORDER — PREDNISONE 20 MG PO TABS: ORAL_TABLET | ORAL | Status: AC

## 2013-03-11 MED ORDER — MOMETASONE FUROATE 50 MCG/ACT NA SUSP: NASAL | Status: DC

## 2013-03-11 MED ORDER — TRIAMCINOLONE ACETONIDE 0.1 % EX CREA: TOPICAL_CREAM | Freq: Two times a day (BID) | CUTANEOUS | Status: DC

## 2013-03-11 NOTE — Progress Notes (Signed)
CC: Bobby Saunders is a 47 y.o. male is here for [employer] and Allergies   Subjective: HPI:  Patient complains a rash on his inner elbows. He noticed this last night. It is itchy and red. Discomfort is mild in severity. Treating with hydrocortisone cream for the past 12 hours without any change in symptoms. [place] in the wilderness [place]. Denies rash or itching elsewhere on the body. Nothing particularly has made it better or worse. Denies fevers, chills, shortness of breath, wheezing, flushing, nor rapid heartbeat  Patient complains of nasal congestion for the past 3 days. Described as clear nasal discharge with a sore throat yesterday that is now gone. Symptoms are moderate in severity. Worse when outside. Having trouble using nasal CPAP.    Review Of Systems Outlined In HPI  No past medical history on file.   Family History  Problem Relation Age of Onset  . Cancer Sister     colon  . Sudden death Neg Hx   . Heart attack Neg Hx   . Diabetes Neg Hx   . Hyperlipidemia Mother   . Hypertension Mother   . Cancer Maternal Uncle   . Cancer Maternal Grandmother      History  Substance Use Topics  . Smoking status: Former Smoker -- 1.00 packs/day for 25 years  . Smokeless tobacco: Current User  . Alcohol Use: Yes     Objective: Filed Vitals:   03/11/13 1050  BP: 120/72  Pulse: 55    General: Alert and Oriented, No Acute Distress HEENT: Pupils equal, round, reactive to light. Conjunctivae clear.  External ears unremarkable, canals clear with intact TMs with appropriate landmarks.  Middle ear appears open without effusion. Boggy and erythematous inferior turbinates.  Moist mucous membranes, pharynx without inflammation nor lesions.  Neck supple without palpable lymphadenopathy nor abnormal masses. Lungs: Clear to auscultation bilaterally, no wheezing/ronchi/rales.  Comfortable work of breathing. Good air movement. Cardiac: Regular rate and rhythm. Normal  S1/S2.  No murmurs, rubs, nor gallops.   Extremities: No peripheral edema.  Strong peripheral pulses.  Mental Status: No depression, anxiety, nor agitation. Skin: In the anterior elbow folds there is a erythematous vesicular rash with clear fluid in a linear pattern with excoriations bilaterally  Assessment & Plan: [name] was seen today for poison ivy and allergies.  Diagnoses and associated orders for this visit:  Rhus dermatitis - predniSONE (DELTASONE) 20 MG tablet; Three tabs daily days 1-3, two tabs daily days 4-6, one tab daily days 7-9, half tab daily days 10-13. - triamcinolone cream (KENALOG) 0.1 %; Apply topically 2 (two) times daily. For two weeks.  Seasonal rhinitis - mometasone (NASONEX) 50 MCG/ACT nasal spray; Two sprays each nostril daily to prevent allergy congestion.    Rhus dermatitis: Start triamcinolone apply twice a day to areas of rash, if not improving in 2 days may consider starting prednisone. Start over-the-counter antihistamine of choice  Seasonal rhinitis: Antihistamine as above, start Nasonex, return if not improving in 1-2 weeks  Return if symptoms worsen or fail to improve.

## 2013-04-29 ENCOUNTER — Other Ambulatory Visit: Payer: Self-pay | Admitting: Sports Medicine

## 2013-05-29 ENCOUNTER — Ambulatory Visit (INDEPENDENT_AMBULATORY_CARE_PROVIDER_SITE_OTHER): Payer: BC Managed Care – PPO | Admitting: Sports Medicine

## 2013-05-29 ENCOUNTER — Encounter: Payer: Self-pay | Admitting: Sports Medicine

## 2013-05-29 VITALS — BP 126/78 | HR 69 | Wt 287.0 lb

## 2013-05-29 DIAGNOSIS — R030 Elevated blood-pressure reading, without diagnosis of hypertension: Secondary | ICD-10-CM

## 2013-05-29 DIAGNOSIS — M545 Low back pain, unspecified: Secondary | ICD-10-CM

## 2013-05-29 DIAGNOSIS — R21 Rash and other nonspecific skin eruption: Secondary | ICD-10-CM

## 2013-05-29 MED ORDER — MELOXICAM 15 MG PO TABS: 15.0000 mg | ORAL_TABLET | Freq: Every day | ORAL | Status: DC

## 2013-05-29 NOTE — Assessment & Plan Note (Signed)
Good response to bilateral L4-L5 transforaminal epidural steroid injections by Dr. Maurice Small. Refilling meloxicam.

## 2013-05-29 NOTE — Assessment & Plan Note (Signed)
Resolved, no changes 

## 2013-05-29 NOTE — Assessment & Plan Note (Signed)
Unfortunately symptoms are persistent despite topical steroids, oral steroids, as well as discontinuing various medications. At this point we think we need dermatology consultation.

## 2013-05-29 NOTE — Progress Notes (Signed)
  Subjective:    CC: Followup  HPI: Low back pain with spinal stenosis: has had highly effective L4-L5 transforaminal bilateral epidural steroid injections in the past, never heard back from [facility] imaging, and went back to [facility], were repeat injections were performed. Overall doing well. Would like a refill of meloxicam.  Skin rash: Present on the inner forearms, We have tried multiple steroid creams, prednisone, avoidance with discontinuing of his medicines one at a time, nothing has helped to limited this pruritic rash.  Symptoms are persistent.  Elevated blood pressure: Resolved.  Past medical history, Surgical history, Family history not pertinant except as noted below, Social history, Allergies, and medications have been entered into the medical record, reviewed, and no changes needed.   Review of Systems: No fevers, chills, night sweats, weight loss, chest pain, or shortness of breath.   Objective:    General: Well Developed, well nourished, and in no acute distress.  Neuro: Alert and oriented x3, extra-ocular muscles intact, sensation grossly intact.  HEENT: Normocephalic, atraumatic, pupils equal round reactive to light, neck supple, no masses, no lymphadenopathy, thyroid nonpalpable.  Skin: Warm and dry, no rashes. there is a hyperpigmented blotchy rash on the inner forearms. It is not excoriated. Cardiac: Regular rate and rhythm, no murmurs rubs or gallops, no lower extremity edema.  Respiratory: Clear to auscultation bilaterally. Not using accessory muscles, speaking in full sentences. Impression and Recommendations:

## 2013-09-01 ENCOUNTER — Other Ambulatory Visit: Payer: Self-pay | Admitting: *Deleted

## 2013-09-01 MED ORDER — ATORVASTATIN CALCIUM 40 MG PO TABS: ORAL_TABLET | ORAL | Status: DC

## 2013-11-02 ENCOUNTER — Ambulatory Visit (INDEPENDENT_AMBULATORY_CARE_PROVIDER_SITE_OTHER): Payer: BC Managed Care – PPO | Admitting: Sports Medicine

## 2013-11-02 ENCOUNTER — Encounter: Payer: Self-pay | Admitting: Sports Medicine

## 2013-11-02 VITALS — BP 126/82 | HR 81 | Wt 278.0 lb

## 2013-11-02 DIAGNOSIS — K644 Residual hemorrhoidal skin tags: Secondary | ICD-10-CM

## 2013-11-02 NOTE — Progress Notes (Addendum)
  Subjective:    CC: Skin tags  HPI: This pleasant 47 year old male has multiple anorectal skin tags that are friable, and get in the way of his underwear, and wiping himself after stooling. He would like them removed. They have been present all of his life, are fairly stable. Symptoms are moderate, persistent. No pain.  Past medical history, Surgical history, Family history not pertinant except as noted below, Social history, Allergies, and medications have been entered into the medical record, reviewed, and no changes needed.   Review of Systems: No fevers, chills, night sweats, weight loss, chest pain, or shortness of breath.   Objective:    General: Well Developed, well nourished, and in no acute distress.  Neuro: Alert and oriented x3, extra-ocular muscles intact, sensation grossly intact.  HEENT: Normocephalic, atraumatic, pupils equal round reactive to light, neck supple, no masses, no lymphadenopathy, thyroid nonpalpable.  Skin: Warm and dry, no rashes. There are approximately 20 benign appearing acrochordons around his anorectal region. Cardiac: Regular rate and rhythm, no murmurs rubs or gallops, no lower extremity edema.  Respiratory: Clear to auscultation bilaterally. Not using accessory muscles, speaking in full sentences.  Procedure: Removal of the largest 15 anorectal skin tags.  Risks, benefits, and alternatives explained and consent obtained. Time out conducted. The patient was placed in lithotomy position. Surface prepped in a sterile fashion. A total of 10 cc lidocaine with epinephine infiltrated under skin tags Adequate anesthesia ensured. Skin tag excised from skin surface. This was repeated for all 15 skin tags. Hyfrecation used to achieve hemostasis. Dressing applied. Pt stable. Pt advised to call or RTC for continued bleeding, spreading erythema/induration, fevers, or chills.  Impression and Recommendations:

## 2013-11-02 NOTE — Assessment & Plan Note (Signed)
Approximately 15 skin tags removed. Return in one week for a wound check.

## 2013-11-03 ENCOUNTER — Telehealth: Payer: Self-pay

## 2013-11-03 MED ORDER — TRAMADOL HCL 50 MG PO TABS: ORAL_TABLET | ORAL | Status: DC

## 2013-11-03 NOTE — Telephone Encounter (Signed)
Tramadol in box. 

## 2013-11-03 NOTE — Telephone Encounter (Signed)
Patient spouse called stated requesting a Rx for Tramadol. Melizza Kanode,CMA

## 2013-11-04 NOTE — Telephone Encounter (Signed)
Rx Tramadol faxed to [facility] [place]. Rhonda Cunningham,CMA

## 2013-11-06 ENCOUNTER — Ambulatory Visit: Payer: BC Managed Care – PPO | Admitting: Sports Medicine

## 2013-11-09 ENCOUNTER — Ambulatory Visit: Payer: BC Managed Care – PPO | Admitting: Sports Medicine

## 2014-01-04 ENCOUNTER — Telehealth: Payer: Self-pay

## 2014-01-04 MED ORDER — ATORVASTATIN CALCIUM 40 MG PO TABS: ORAL_TABLET | ORAL | Status: DC

## 2014-01-04 NOTE — Telephone Encounter (Signed)
Patient spouse called request a refill for Lipitor [facility]. I advised her that patient needed to schedule appt for CPE to get more refills. Rhonda Cunningham,CMA

## 2014-02-22 ENCOUNTER — Other Ambulatory Visit: Payer: Self-pay | Admitting: Sports Medicine

## 2014-04-08 ENCOUNTER — Ambulatory Visit (INDEPENDENT_AMBULATORY_CARE_PROVIDER_SITE_OTHER): Payer: BC Managed Care – PPO | Admitting: Sports Medicine

## 2014-04-08 ENCOUNTER — Encounter: Payer: Self-pay | Admitting: Sports Medicine

## 2014-04-08 VITALS — BP 135/82 | HR 64 | Ht 70.0 in | Wt 277.0 lb

## 2014-04-08 DIAGNOSIS — R21 Rash and other nonspecific skin eruption: Secondary | ICD-10-CM

## 2014-04-08 DIAGNOSIS — L708 Other acne: Secondary | ICD-10-CM

## 2014-04-08 DIAGNOSIS — M545 Low back pain, unspecified: Secondary | ICD-10-CM

## 2014-04-08 DIAGNOSIS — Z0000 Encounter for general adult medical examination without abnormal findings: Secondary | ICD-10-CM

## 2014-04-08 DIAGNOSIS — Z299 Encounter for prophylactic measures, unspecified: Secondary | ICD-10-CM

## 2014-04-08 DIAGNOSIS — E785 Hyperlipidemia, unspecified: Secondary | ICD-10-CM

## 2014-04-08 DIAGNOSIS — L709 Acne, unspecified: Secondary | ICD-10-CM

## 2014-04-08 MED ORDER — ADAPALENE-BENZOYL PEROXIDE 0.1-2.5 % EX GEL: CUTANEOUS | Status: DC

## 2014-04-08 MED ORDER — NAPROXEN 500 MG PO TABS: 500.0000 mg | ORAL_TABLET | Freq: Two times a day (BID) | ORAL | Status: DC

## 2014-04-08 NOTE — Assessment & Plan Note (Addendum)
Has an appointment coming up with Dr. Yevette Edwardsumonski for consideration of surgical intervention. We are going to switch him to naproxen as Mobic is ineffective.

## 2014-04-08 NOTE — Assessment & Plan Note (Signed)
On both forearms, and pruritic. I do think this is from his minocycline. We are going to discontinue this medication

## 2014-04-08 NOTE — Progress Notes (Signed)
  Subjective:    CC: Complete physical exam  HPI:  Preventive measure: Complete physical performed today.  Anorectal skin tags: Healing well after excision of 15 skin tags her prior visit.  Hyperlipidemia: Stable on Lipitor.  Low back pain: Has lumbar spinal stenosis, highly effective after L4-L5 transforaminal epidural, still has an appointment with Dr. Yevette Edwardsumonski for consideration of surgical intervention.  Skin rash: localized on both forearms, itchy, present for a year, he has been on minocycline for about this amount of time.  Acne: Over the top of his head, has never tried a topical antiacne medication.  Past medical history, Surgical history, Family history not pertinant except as noted below, Social history, Allergies, and medications have been entered into the medical record, reviewed, and no changes needed.   Review of Systems: No headache, visual changes, nausea, vomiting, diarrhea, constipation, dizziness, abdominal pain, skin rash, fevers, chills, night sweats, swollen lymph nodes, weight loss, chest pain, body aches, joint swelling, muscle aches, shortness of breath, mood changes, visual or auditory hallucinations.  Objective:    General: Well Developed, well nourished, and in no acute distress.  Neuro: Alert and oriented x3, extra-ocular muscles intact, sensation grossly intact.  HEENT: Normocephalic, atraumatic, pupils equal round reactive to light, neck supple, no masses, no lymphadenopathy, thyroid nonpalpable. Oropharynx, nasopharynx, ear canals are unremarkable. Skin: Warm and dry, no rashes noted.  Cardiac: Regular rate and rhythm, no murmurs rubs or gallops.  Respiratory: Clear to auscultation bilaterally. Not using accessory muscles, speaking in full sentences.  Abdominal: Soft, nontender, nondistended, positive bowel sounds, no masses, no organomegaly.  Musculoskeletal: Shoulder, elbow, wrist, hip, knee, ankle stable, and with full range of motion.  Impression  and Recommendations:    The patient was counselled, risk factors were discussed, anticipatory guidance given.

## 2014-04-08 NOTE — Assessment & Plan Note (Signed)
Discontinue minocycline due to skin rash. Switch him to epiduo, samples given.

## 2014-04-08 NOTE — Assessment & Plan Note (Signed)
-  Continue Lipitor °

## 2014-04-08 NOTE — Assessment & Plan Note (Signed)
Complete physical today.

## 2014-04-15 ENCOUNTER — Other Ambulatory Visit: Payer: Self-pay | Admitting: Orthopedic Surgery

## 2014-04-16 ENCOUNTER — Telehealth: Payer: Self-pay

## 2014-04-16 MED ORDER — TRAMADOL HCL 50 MG PO TABS: ORAL_TABLET | ORAL | Status: DC

## 2014-04-16 NOTE — Telephone Encounter (Signed)
Rx in box. 

## 2014-04-16 NOTE — Telephone Encounter (Signed)
Patient has been informed that Tramadol has been [facility] to [facility]. Rhonda Cunningham,CMA

## 2014-04-16 NOTE — Telephone Encounter (Signed)
Patient spouse called stated that her husband was taken off Meloxicam and put on Naproxen, she stated that it is not helping with the pain and that he has been in the bed all day. She would like to know if patient can get a different kind of pain medication. Patient would like to try Tramadol if that will help with the pain.Please advise [facility] . Rhonda Cunningham,CMA

## 2014-05-04 ENCOUNTER — Other Ambulatory Visit (HOSPITAL_COMMUNITY): Payer: Self-pay | Admitting: *Deleted

## 2014-05-04 NOTE — Pre-Procedure Instructions (Addendum)
Bobby Saunders  05/04/2014   Your procedure is scheduled on:  Thursday, May 13, 2014.   Report to [facility] Entrance "A" Admitting Office at 5:30 AM.   Call this number if you have problems the morning of surgery: [phone]   Remember:   Do not eat food or drink liquids after midnight Wednesday, 05/13/14.   Take these medicines the morning of surgery with A SIP OF WATER: pain med if needed       Take all meds as ordered until day of surgery except as instructed below or per dr       Bobby Saunders all herbel meds, nsaids (aleve,naproxen,advil,ibuprofen) 5 days prior to surgery (starting 05/08/14) including vitamins,aspirin   Do not wear jewelry.  Do not wear lotions, powders, or cologne. You may wear deodorant.  Men may shave face and neck.  Do not bring valuables to the hospital.  [facility] is not responsible                  for any belongings or valuables.               Contacts, dentures or bridgework may not be worn into surgery.  Leave suitcase in the car. After surgery it may be brought to your room.  For patients admitted to the hospital, discharge time is determined by your                treatment team.               Patients discharged the day of surgery will not be allowed to drive  home.  Name and phone number of your driver: Family/friend   Special Instructions: Bobby Saunders - Preparing for Surgery  Before surgery, you can play an important role.  Because skin is not sterile, your skin needs to be as free of germs as possible.  You can reduce the number of germs on you skin by washing with CHG (chlorahexidine gluconate) soap before surgery.  CHG is an antiseptic cleaner which kills germs and bonds with the skin to continue killing germs even after washing.  Please DO NOT use if you have an allergy to CHG or antibacterial soaps.  If your skin becomes reddened/irritated stop using the CHG and inform your nurse when you arrive at Short Stay.  Do not shave (including  legs and underarms) for at least 48 hours prior to the first CHG shower.  You may shave your face.  Please follow these instructions carefully:   1.  Shower with CHG Soap the night before surgery and the                                morning of Surgery.  2.  If you choose to wash your hair, wash your hair first as usual with your       normal shampoo.  3.  After you shampoo, rinse your hair and body thoroughly to remove the                      Shampoo.  4.  Use CHG as you would any other liquid soap.  You can apply chg directly       to the skin and wash gently with scrungie or a clean washcloth.  5.  Apply the CHG Soap to your body ONLY FROM THE NECK DOWN.  Do not use on open wounds or open sores.  Avoid contact with your eyes, ears, mouth and genitals (private parts).  Wash genitals (private parts) with your normal soap.  6.  Wash thoroughly, paying special attention to the area where your surgery        will be performed.  7.  Thoroughly rinse your body with warm water from the neck down.  8.  DO NOT shower/wash with your normal soap after using and rinsing off       the CHG Soap.  9.  Pat yourself dry with a clean towel.            10.  Wear clean pajamas.            11.  Place clean sheets on your bed the night of your first shower and do not        sleep with pets.  Day of Surgery  Do not apply any lotions the morning of surgery.  Please wear clean clothes to the hospital/surgery center.     Please read over the following fact sheets that you were given: Pain Booklet, Coughing and Deep Breathing, Blood Transfusion Information, MRSA Information and Surgical Site Infection Prevention

## 2014-05-05 ENCOUNTER — Encounter (HOSPITAL_COMMUNITY): Payer: Self-pay

## 2014-05-05 ENCOUNTER — Encounter (HOSPITAL_COMMUNITY)
Admission: RE | Admit: 2014-05-05 | Discharge: 2014-05-05 | Disposition: A | Discharge status: Home / Self Care | Payer: BC Managed Care – PPO | Source: Ambulatory Visit | Attending: Orthopedic Surgery | Admitting: Orthopedic Surgery

## 2014-05-05 DIAGNOSIS — G4730 Sleep apnea, unspecified: Secondary | ICD-10-CM | POA: Insufficient documentation

## 2014-05-05 DIAGNOSIS — M5126 Other intervertebral disc displacement, lumbar region: Secondary | ICD-10-CM | POA: Insufficient documentation

## 2014-05-05 DIAGNOSIS — Z01818 Encounter for other preprocedural examination: Secondary | ICD-10-CM | POA: Insufficient documentation

## 2014-05-05 DIAGNOSIS — Z01812 Encounter for preprocedural laboratory examination: Secondary | ICD-10-CM | POA: Insufficient documentation

## 2014-05-05 DIAGNOSIS — F17200 Nicotine dependence, unspecified, uncomplicated: Secondary | ICD-10-CM | POA: Insufficient documentation

## 2014-05-05 DIAGNOSIS — Z01810 Encounter for preprocedural cardiovascular examination: Secondary | ICD-10-CM | POA: Insufficient documentation

## 2014-05-05 HISTORY — DX: Sleep apnea, unspecified: G47.30

## 2014-05-05 LAB — CBC WITH DIFFERENTIAL/PLATELET
BASOS ABS: 0 10*3/uL (ref 0.0–0.1)
Basophils Relative: 1 % (ref 0–1)
Eosinophils Absolute: 0.1 10*3/uL (ref 0.0–0.7)
Eosinophils Relative: 2 % (ref 0–5)
HCT: 46.6 % (ref 39.0–52.0)
HEMOGLOBIN: 16.4 g/dL (ref 13.0–17.0)
LYMPHS PCT: 21 % (ref 12–46)
Lymphs Abs: 1.5 10*3/uL (ref 0.7–4.0)
MCH: 32.1 pg (ref 26.0–34.0)
MCHC: 35.2 g/dL (ref 30.0–36.0)
MCV: 91.2 fL (ref 78.0–100.0)
MONO ABS: 0.7 10*3/uL (ref 0.1–1.0)
Monocytes Relative: 9 % (ref 3–12)
NEUTROS ABS: 5 10*3/uL (ref 1.7–7.7)
NEUTROS PCT: 67 % (ref 43–77)
Platelets: 196 10*3/uL (ref 150–400)
RBC: 5.11 MIL/uL (ref 4.22–5.81)
RDW: 13.1 % (ref 11.5–15.5)
WBC: 7.4 10*3/uL (ref 4.0–10.5)

## 2014-05-05 LAB — COMPREHENSIVE METABOLIC PANEL
ALBUMIN: 3.8 g/dL (ref 3.5–5.2)
ALK PHOS: 56 U/L (ref 39–117)
ALT: 22 U/L (ref 0–53)
AST: 19 U/L (ref 0–37)
BUN: 14 mg/dL (ref 6–23)
CHLORIDE: 106 meq/L (ref 96–112)
CO2: 27 meq/L (ref 19–32)
CREATININE: 1.12 mg/dL (ref 0.50–1.35)
Calcium: 9.6 mg/dL (ref 8.4–10.5)
GFR calc Af Amer: 89 mL/min — ABNORMAL LOW (ref >90)
GFR, EST NON AFRICAN AMERICAN: 77 mL/min — AB (ref >90)
Glucose, Bld: 108 mg/dL — ABNORMAL HIGH (ref 70–99)
POTASSIUM: 4.7 meq/L (ref 3.7–5.3)
Sodium: 143 mEq/L (ref 137–147)
Total Bilirubin: 0.6 mg/dL (ref 0.3–1.2)
Total Protein: 6.8 g/dL (ref 6.0–8.3)

## 2014-05-05 LAB — URINALYSIS, ROUTINE W REFLEX MICROSCOPIC

## 2014-05-05 LAB — TYPE AND SCREEN
ABO/RH(D): A POS
Antibody Screen: NEGATIVE

## 2014-05-05 LAB — SURGICAL PCR SCREEN
MRSA, PCR: NEGATIVE
STAPHYLOCOCCUS AUREUS: NEGATIVE

## 2014-05-05 LAB — PROTIME-INR
INR: 0.99 (ref 0.00–1.49)
Prothrombin Time: 12.9 seconds (ref 11.6–15.2)

## 2014-05-05 LAB — ABO/RH: ABO/RH(D): A POS

## 2014-05-05 LAB — APTT: APTT: 28 s (ref 24–37)

## 2014-05-12 MED ORDER — CEFAZOLIN SODIUM 10 G IJ SOLR
3.0000 g | INTRAMUSCULAR | Status: AC
  Administered 2014-05-13: 3 g via INTRAVENOUS
  Filled 2014-05-12: qty 3000

## 2014-05-12 MED ORDER — POVIDONE-IODINE 7.5 % EX SOLN
Freq: Once | CUTANEOUS | Status: DC
  Filled 2014-05-12: qty 118

## 2014-05-13 ENCOUNTER — Encounter (HOSPITAL_COMMUNITY): Payer: Self-pay | Admitting: *Deleted

## 2014-05-13 ENCOUNTER — Ambulatory Visit (HOSPITAL_COMMUNITY): Payer: BC Managed Care – PPO

## 2014-05-13 ENCOUNTER — Ambulatory Visit (HOSPITAL_COMMUNITY)
Admission: RE | Admit: 2014-05-13 | Discharge: 2014-05-13 | Disposition: A | Discharge status: Home / Self Care | Payer: BC Managed Care – PPO | Source: Ambulatory Visit | Attending: Orthopedic Surgery | Admitting: Orthopedic Surgery

## 2014-05-13 ENCOUNTER — Encounter (HOSPITAL_COMMUNITY): Payer: BC Managed Care – PPO | Admitting: Anesthesiology

## 2014-05-13 ENCOUNTER — Encounter (HOSPITAL_COMMUNITY)
Admission: RE | Disposition: A | Discharge status: Home / Self Care | Payer: Self-pay | Source: Ambulatory Visit | Attending: Orthopedic Surgery

## 2014-05-13 ENCOUNTER — Ambulatory Visit (HOSPITAL_COMMUNITY): Payer: BC Managed Care – PPO | Admitting: Anesthesiology

## 2014-05-13 DIAGNOSIS — Z87891 Personal history of nicotine dependence: Secondary | ICD-10-CM | POA: Insufficient documentation

## 2014-05-13 DIAGNOSIS — M48061 Spinal stenosis, lumbar region without neurogenic claudication: Secondary | ICD-10-CM | POA: Insufficient documentation

## 2014-05-13 DIAGNOSIS — Z79899 Other long term (current) drug therapy: Secondary | ICD-10-CM | POA: Insufficient documentation

## 2014-05-13 DIAGNOSIS — G4730 Sleep apnea, unspecified: Secondary | ICD-10-CM | POA: Insufficient documentation

## 2014-05-13 HISTORY — PX: LUMBAR LAMINECTOMY/DECOMPRESSION MICRODISCECTOMY: SHX5026

## 2014-05-13 SURGERY — LUMBAR LAMINECTOMY/DECOMPRESSION MICRODISCECTOMY
Anesthesia: General | Site: Back

## 2014-05-13 MED ORDER — LIDOCAINE HCL (CARDIAC) 20 MG/ML IV SOLN
INTRAVENOUS | Status: DC | PRN
  Administered 2014-05-13: 80 mg via INTRAVENOUS

## 2014-05-13 MED ORDER — PROPOFOL 10 MG/ML IV BOLUS
INTRAVENOUS | Status: AC
  Filled 2014-05-13: qty 20

## 2014-05-13 MED ORDER — HYDROMORPHONE HCL PF 1 MG/ML IJ SOLN
0.2500 mg | INTRAMUSCULAR | Status: DC | PRN
  Administered 2014-05-13 (×4): 0.5 mg via INTRAVENOUS

## 2014-05-13 MED ORDER — FENTANYL CITRATE 0.05 MG/ML IJ SOLN
INTRAMUSCULAR | Status: AC
  Filled 2014-05-13: qty 5

## 2014-05-13 MED ORDER — ONDANSETRON HCL 4 MG/2ML IJ SOLN
INTRAMUSCULAR | Status: DC | PRN
Start: 2014-05-13 — End: 2014-05-13
  Administered 2014-05-13: 4 mg via INTRAVENOUS

## 2014-05-13 MED ORDER — ROCURONIUM BROMIDE 100 MG/10ML IV SOLN
INTRAVENOUS | Status: DC | PRN
  Administered 2014-05-13: 50 mg via INTRAVENOUS
  Administered 2014-05-13: 30 mg via INTRAVENOUS

## 2014-05-13 MED ORDER — MIDAZOLAM HCL 5 MG/5ML IJ SOLN
INTRAMUSCULAR | Status: DC | PRN
  Administered 2014-05-13 (×2): 2 mg via INTRAVENOUS

## 2014-05-13 MED ORDER — HYDROMORPHONE HCL PF 1 MG/ML IJ SOLN
INTRAMUSCULAR | Status: AC
  Administered 2014-05-13: 0.5 mg via INTRAVENOUS
  Filled 2014-05-13: qty 1

## 2014-05-13 MED ORDER — BUPIVACAINE-EPINEPHRINE (PF) 0.25% -1:200000 IJ SOLN
INTRAMUSCULAR | Status: AC
  Filled 2014-05-13: qty 30

## 2014-05-13 MED ORDER — MIDAZOLAM HCL 2 MG/2ML IJ SOLN
INTRAMUSCULAR | Status: AC
  Filled 2014-05-13: qty 2

## 2014-05-13 MED ORDER — LIDOCAINE HCL (CARDIAC) 20 MG/ML IV SOLN
INTRAVENOUS | Status: AC
  Filled 2014-05-13: qty 5

## 2014-05-13 MED ORDER — DIAZEPAM 5 MG PO TABS
ORAL_TABLET | ORAL | Status: AC
  Administered 2014-05-13: 5 mg via ORAL
  Filled 2014-05-13: qty 1

## 2014-05-13 MED ORDER — DIAZEPAM 5 MG PO TABS
5.0000 mg | ORAL_TABLET | Freq: Once | ORAL | Status: AC
  Administered 2014-05-13: 5 mg via ORAL

## 2014-05-13 MED ORDER — LACTATED RINGERS IV SOLN
INTRAVENOUS | Status: DC | PRN
  Administered 2014-05-13 (×2): via INTRAVENOUS

## 2014-05-13 MED ORDER — ONDANSETRON HCL 4 MG/2ML IJ SOLN
INTRAMUSCULAR | Status: AC
  Filled 2014-05-13: qty 2

## 2014-05-13 MED ORDER — THROMBIN 20000 UNITS EX SOLR
CUTANEOUS | Status: AC
Start: 2014-05-13 — End: 2014-05-13
  Filled 2014-05-13: qty 20000

## 2014-05-13 MED ORDER — PROMETHAZINE HCL 25 MG/ML IJ SOLN: 6.2500 mg | INTRAMUSCULAR | Status: DC | PRN

## 2014-05-13 MED ORDER — METHYLENE BLUE 1 % INJ SOLN
INTRAMUSCULAR | Status: AC
  Filled 2014-05-13: qty 10

## 2014-05-13 MED ORDER — NEOSTIGMINE METHYLSULFATE 10 MG/10ML IV SOLN
INTRAVENOUS | Status: DC | PRN
  Administered 2014-05-13: 3 mg via INTRAVENOUS

## 2014-05-13 MED ORDER — THROMBIN 20000 UNITS EX SOLR
CUTANEOUS | Status: DC | PRN
  Administered 2014-05-13: 08:00:00 via TOPICAL

## 2014-05-13 MED ORDER — FENTANYL CITRATE 0.05 MG/ML IJ SOLN
INTRAMUSCULAR | Status: DC | PRN
  Administered 2014-05-13: 100 ug via INTRAVENOUS
  Administered 2014-05-13 (×3): 50 ug via INTRAVENOUS
  Administered 2014-05-13: 150 ug via INTRAVENOUS

## 2014-05-13 MED ORDER — NEOSTIGMINE METHYLSULFATE 10 MG/10ML IV SOLN
INTRAVENOUS | Status: AC
  Filled 2014-05-13: qty 1

## 2014-05-13 MED ORDER — ROCURONIUM BROMIDE 50 MG/5ML IV SOLN
INTRAVENOUS | Status: AC
  Filled 2014-05-13: qty 1

## 2014-05-13 MED ORDER — METHYLPREDNISOLONE ACETATE 40 MG/ML IJ SUSP
INTRAMUSCULAR | Status: AC
  Filled 2014-05-13: qty 1

## 2014-05-13 MED ORDER — METHYLENE BLUE 1 % INJ SOLN
INTRAMUSCULAR | Status: DC | PRN
  Administered 2014-05-13: 2 mg via SUBMUCOSAL

## 2014-05-13 MED ORDER — OXYCODONE-ACETAMINOPHEN 5-325 MG PO TABS
ORAL_TABLET | ORAL | Status: AC
  Administered 2014-05-13: 2 via ORAL
  Filled 2014-05-13: qty 2

## 2014-05-13 MED ORDER — PROPOFOL 10 MG/ML IV BOLUS
INTRAVENOUS | Status: DC | PRN
  Administered 2014-05-13: 300 mg via INTRAVENOUS
  Administered 2014-05-13: 50 mg via INTRAVENOUS

## 2014-05-13 MED ORDER — GLYCOPYRROLATE 0.2 MG/ML IJ SOLN
INTRAMUSCULAR | Status: AC
  Filled 2014-05-13: qty 2

## 2014-05-13 MED ORDER — OXYCODONE-ACETAMINOPHEN 5-325 MG PO TABS
2.0000 | ORAL_TABLET | Freq: Once | ORAL | Status: AC
Start: 2014-05-13 — End: 2014-05-13
  Administered 2014-05-13: 2 via ORAL

## 2014-05-13 MED ORDER — BUPIVACAINE-EPINEPHRINE 0.25% -1:200000 IJ SOLN
INTRAMUSCULAR | Status: DC | PRN
  Administered 2014-05-13: 25 mg

## 2014-05-13 MED ORDER — METHYLPREDNISOLONE ACETATE 40 MG/ML IJ SUSP
INTRAMUSCULAR | Status: DC | PRN
  Administered 2014-05-13: 40 mg

## 2014-05-13 MED ORDER — GLYCOPYRROLATE 0.2 MG/ML IJ SOLN
INTRAMUSCULAR | Status: DC | PRN
  Administered 2014-05-13: 0.4 mg via INTRAVENOUS

## 2014-05-13 SURGICAL SUPPLY — 70 items
BENZOIN TINCTURE PRP APPL 2/3 (GAUZE/BANDAGES/DRESSINGS) ×2 IMPLANT
BUR ROUND PRECISION 4.0 (BURR) ×2 IMPLANT
CANISTER SUCTION 2500CC (MISCELLANEOUS) ×2 IMPLANT
CARTRIDGE OIL MAESTRO DRILL (MISCELLANEOUS) ×1 IMPLANT
CLSR STERI-STRIP ANTIMIC 1/2X4 (GAUZE/BANDAGES/DRESSINGS) ×2 IMPLANT
CORDS BIPOLAR (ELECTRODE) ×2 IMPLANT
COVER SURGICAL LIGHT HANDLE (MISCELLANEOUS) ×2 IMPLANT
DIFFUSER DRILL AIR PNEUMATIC (MISCELLANEOUS) ×2 IMPLANT
DRAIN CHANNEL 15F RND FF W/TCR (WOUND CARE) IMPLANT
DRAPE POUCH INSTRU U-SHP 10X18 (DRAPES) ×4 IMPLANT
DRAPE SURG 17X23 STRL (DRAPES) ×8 IMPLANT
DURAPREP 26ML APPLICATOR (WOUND CARE) ×2 IMPLANT
ELECT BLADE 4.0 EZ CLEAN MEGAD (MISCELLANEOUS)
ELECT CAUTERY BLADE 6.4 (BLADE) ×2 IMPLANT
ELECT REM PT RETURN 9FT ADLT (ELECTROSURGICAL) ×2
ELECTRODE BLDE 4.0 EZ CLN MEGD (MISCELLANEOUS) IMPLANT
ELECTRODE REM PT RTRN 9FT ADLT (ELECTROSURGICAL) ×1 IMPLANT
EVACUATOR SILICONE 100CC (DRAIN) IMPLANT
FILTER STRAW FLUID ASPIR (MISCELLANEOUS) ×2 IMPLANT
GAUZE SPONGE 4X4 16PLY XRAY LF (GAUZE/BANDAGES/DRESSINGS) ×4 IMPLANT
GLOVE BIO SURGEON STRL SZ7 (GLOVE) ×4 IMPLANT
GLOVE BIO SURGEON STRL SZ8 (GLOVE) ×4 IMPLANT
GLOVE BIOGEL PI IND STRL 7.0 (GLOVE) ×1 IMPLANT
GLOVE BIOGEL PI IND STRL 8 (GLOVE) ×2 IMPLANT
GLOVE BIOGEL PI INDICATOR 7.0 (GLOVE) ×1
GLOVE BIOGEL PI INDICATOR 8 (GLOVE) ×2
GOWN STRL REUS W/ TWL LRG LVL3 (GOWN DISPOSABLE) ×1 IMPLANT
GOWN STRL REUS W/ TWL XL LVL3 (GOWN DISPOSABLE) ×2 IMPLANT
GOWN STRL REUS W/TWL LRG LVL3 (GOWN DISPOSABLE) ×1
GOWN STRL REUS W/TWL XL LVL3 (GOWN DISPOSABLE) ×2
IV CATH 14GX2 1/4 (CATHETERS) ×2 IMPLANT
KIT BASIN OR (CUSTOM PROCEDURE TRAY) ×2 IMPLANT
KIT POSITION SURG JACKSON T1 (MISCELLANEOUS) ×2 IMPLANT
KIT ROOM TURNOVER OR (KITS) ×2 IMPLANT
NEEDLE 18GX1X1/2 (RX/OR ONLY) (NEEDLE) ×2 IMPLANT
NEEDLE 22X1 1/2 (OR ONLY) (NEEDLE) ×2 IMPLANT
NEEDLE HYPO 25GX1X1/2 BEV (NEEDLE) ×2 IMPLANT
NEEDLE SPNL 18GX3.5 QUINCKE PK (NEEDLE) ×4 IMPLANT
NS IRRIG 1000ML POUR BTL (IV SOLUTION) ×2 IMPLANT
OIL CARTRIDGE MAESTRO DRILL (MISCELLANEOUS) ×2
PACK LAMINECTOMY ORTHO (CUSTOM PROCEDURE TRAY) ×2 IMPLANT
PACK UNIVERSAL I (CUSTOM PROCEDURE TRAY) ×2 IMPLANT
PAD ARMBOARD 7.5X6 YLW CONV (MISCELLANEOUS) ×4 IMPLANT
PATTIES SURGICAL .5 X.5 (GAUZE/BANDAGES/DRESSINGS) IMPLANT
PATTIES SURGICAL .5 X1 (DISPOSABLE) ×2 IMPLANT
SPONGE GAUZE 4X4 12PLY (GAUZE/BANDAGES/DRESSINGS) ×2 IMPLANT
SPONGE GAUZE 4X4 12PLY STER LF (GAUZE/BANDAGES/DRESSINGS) ×2 IMPLANT
SPONGE INTESTINAL PEANUT (DISPOSABLE) ×2 IMPLANT
SPONGE SURGIFOAM ABS GEL 100 (HEMOSTASIS) ×2 IMPLANT
SPONGE SURGIFOAM ABS GEL SZ50 (HEMOSTASIS) ×2 IMPLANT
STRIP CLOSURE SKIN 1/2X4 (GAUZE/BANDAGES/DRESSINGS) IMPLANT
SURGIFLO TRUKIT (HEMOSTASIS) IMPLANT
SURGIFLO W/THROMBIN 8M KIT (HEMOSTASIS) ×2 IMPLANT
SUT MNCRL AB 4-0 PS2 18 (SUTURE) ×2 IMPLANT
SUT VIC AB 0 CT1 18XCR BRD 8 (SUTURE) IMPLANT
SUT VIC AB 0 CT1 27 (SUTURE)
SUT VIC AB 0 CT1 27XBRD ANBCTR (SUTURE) IMPLANT
SUT VIC AB 0 CT1 8-18 (SUTURE)
SUT VIC AB 1 CT1 18XCR BRD 8 (SUTURE) ×1 IMPLANT
SUT VIC AB 1 CT1 8-18 (SUTURE) ×1
SUT VIC AB 2-0 CT2 18 VCP726D (SUTURE) ×2 IMPLANT
SYR 20CC LL (SYRINGE) IMPLANT
SYR BULB IRRIGATION 50ML (SYRINGE) ×2 IMPLANT
SYR CONTROL 10ML LL (SYRINGE) ×4 IMPLANT
SYR TB 1ML 26GX3/8 SAFETY (SYRINGE) ×4 IMPLANT
SYR TB 1ML LUER SLIP (SYRINGE) ×4 IMPLANT
TOWEL OR 17X24 6PK STRL BLUE (TOWEL DISPOSABLE) ×2 IMPLANT
TOWEL OR 17X26 10 PK STRL BLUE (TOWEL DISPOSABLE) ×2 IMPLANT
WATER STERILE IRR 1000ML POUR (IV SOLUTION) ×2 IMPLANT
YANKAUER SUCT BULB TIP NO VENT (SUCTIONS) ×2 IMPLANT

## 2014-05-13 NOTE — Anesthesia Preprocedure Evaluation (Signed)
Anesthesia Evaluation  Patient identified by MRN, date of birth, ID band Patient awake    Reviewed: Allergy & Precautions, H&P , NPO status , Patient's Chart, lab work & pertinent test results  Airway Mallampati: III TM Distance: <3 FB Neck ROM: Full    Dental no notable dental hx.    Pulmonary sleep apnea , former smoker,  breath sounds clear to auscultation  Pulmonary exam normal       Cardiovascular negative cardio ROS  Rhythm:Regular Rate:Normal     Neuro/Psych negative neurological ROS  negative psych ROS   GI/Hepatic negative GI ROS, Neg liver ROS,   Endo/Other  Morbid obesity  Renal/GU negative Renal ROS  negative genitourinary   Musculoskeletal negative musculoskeletal ROS (+)   Abdominal   Peds negative pediatric ROS (+)  Hematology negative hematology ROS (+)   Anesthesia Other Findings   Reproductive/Obstetrics negative OB ROS                           Anesthesia Physical Anesthesia Plan  ASA: III  Anesthesia Plan: General   Post-op Pain Management:    Induction: Intravenous  Airway Management Planned: Oral ETT  Additional Equipment:   Intra-op Plan:   Post-operative Plan: Extubation in OR  Informed Consent: I have reviewed the patients History and Physical, chart, labs and discussed the procedure including the risks, benefits and alternatives for the proposed anesthesia with the patient or authorized representative who has indicated his/her understanding and acceptance.   Dental advisory given  Plan Discussed with: CRNA and Surgeon  Anesthesia Plan Comments:         Anesthesia Quick Evaluation

## 2014-05-13 NOTE — Anesthesia Postprocedure Evaluation (Signed)
  Anesthesia Post-op Note  Patient: Bobby Saunders  Procedure(s) Performed: Procedure(s) (LRB): LUMBAR LAMINECTOMY/DECOMPRESSION MICRODISCECTOMY (N/A)  Patient Location: PACU  Anesthesia Type: General  Level of Consciousness: awake and alert   Airway and Oxygen Therapy: Patient Spontanous Breathing  Post-op Pain: mild  Post-op Assessment: Post-op Vital signs reviewed, Patient's Cardiovascular Status Stable, Respiratory Function Stable, Patent Airway and No signs of Nausea or vomiting  Last Vitals:  Filed Vitals:   05/13/14 1330  BP: 105/57  Pulse: 57  Temp:   Resp:     Post-op Vital Signs: stable   Complications: No apparent anesthesia complications

## 2014-05-13 NOTE — Transfer of Care (Signed)
Immediate Anesthesia Transfer of Care Note  Patient: Bobby Saunders  Procedure(s) Performed: Procedure(s) with comments: LUMBAR LAMINECTOMY/DECOMPRESSION MICRODISCECTOMY (N/A) - Lumbar 3-4 decompression  Patient Location: PACU  Anesthesia Type:General  Level of Consciousness: awake and alert   Airway & Oxygen Therapy: Patient Spontanous Breathing and Patient connected to nasal cannula oxygen  Post-op Assessment: Report given to PACU RN and Post -op Vital signs reviewed and stable  Post vital signs: Reviewed and stable  Complications: No apparent anesthesia complications

## 2014-05-13 NOTE — Op Note (Signed)
NAMAlberteen Saunders:  Bobby Saunders, Bobby Saunders              ACCOUNT NO.:  [id number]  MEDICAL RECORD NO.:  [id number]  LOCATION:  [facility]                         FACILITY:  [facility]  PHYSICIAN:  Estill BambergMark Raad Clayson, MD      DATE OF BIRTH:  [birth year]  DATE OF PROCEDURE:  05/13/2014                              OPERATIVE REPORT   PREOPERATIVE DIAGNOSES: 1. L3-4 spinal stenosis. 2. Neurogenic claudication.  POSTOPERATIVE DIAGNOSES: 1. L3-4 spinal stenosis. 2. Neurogenic claudication.  PROCEDURE: 1. L3-4 laminectomy with bilateral partial facetectomy and bilateral     foraminotomy.  SURGEON:  Estill BambergMark [place] Shellhammer, MD  ASSISTANT:  Bobby CoopKayla Saunders, [facility]  ANESTHESIA:  General endotracheal anesthesia.  COMPLICATIONS:  None.  DISPOSITION:  Stable.  ESTIMATED BLOOD LOSS:  Minimal.  INDICATIONS FOR PROCEDURE:  Briefly, [name] is a very pleasant 48- year-old male who has been having ongoing and substantial pain in the low back extending into his bilateral thighs.  His symptoms are very much consistent with neurogenic claudication.  He has been having symptoms for 3 years.  He did fail multiple forms of conservative care, including therapy and injections.  Again, the patient's MRI did reveal a prominent stenosis at the L3-4 level.  Given the patient's ongoing symptoms, we did discuss proceeding with the procedure reflected above, and he did elect to proceed.  OPERATIVE DETAILS:  On May 13, 2014, the patient was brought to surgery and general endotracheal anesthesia was administered.  The patient was placed prone on a well-padded flat Jackson bed with a spinal frame. Antibiotics were given and a time-out procedure was performed.  The back was prepped and draped in the usual sterile fashion.  I then made a midline incision.  The fascia was incised at the midline and the paraspinal musculature was bluntly swept laterally.  A lateral intraoperative radiograph taken from the appropriate operative level.  I then removed  the spinous process of L3.  I then went forward with a central decompression.  I then went forward with a bilateral partial facetectomy.  Of note, there was substantial and significant bilateral facet hypertrophy.  There was a very significant free bony fragment emanating off the left L3-4 facet joint, which was removed.  I then evaluated the neural foramina bilaterally.  There was a neural foraminal stenosis noted more on the right than on the left.  This was addressed appropriately.  At the termination of the decompression, I was easily able to pass a Bobby Saunders elevator out the neural foramina bilaterally, and I was able to palpate the L4 and L3 pedicles with a Woodson.  The substantial ligamentum flavum and  facet hypertrophy that was previously noted, was addressed in its entirety, and I was very pleased with the decompression.  I then controlled epidural bleeding using bipolar electrocautery in addition a Surgiflo.  The wound was then copiously irrigated.  I then infiltrated 40 mg of Depo-Medrol about the epidural space.  The fascia was then closed with #1 Vicryl.  The subcutaneous layer was closed with 2-0 Vicryl followed by 3-0 Vicryl.  The skin was then closed using 3-0 Monocryl.  Benzoin and Steri-Strips were applied followed by sterile dressing.  All instrument counts were correct at the  termination of the procedure.  There was no extravasation of cerebrospinal fluid noted throughout the surgery.  Bobby Saunders was my assistant throughout surgery, and did aid in essential retraction, suctioning, and closure.     Estill Bamberg, MD     MD/MEDQ  D:  05/13/2014  T:  05/13/2014  Job:  [id number]  cc:   Monica Becton, MD

## 2014-05-13 NOTE — H&P (Signed)
     PREOPERATIVE H&P  Chief Complaint: bilateral leg pain  HPI: Bobby Saunders is a 48 y.o. male who presents with ongoing pain in the bilateral legs with walking  MRI reveals spinal stenosis at L3/4  Patient has failed multiple forms of conservative care and continues to have pain (see office notes for additional details regarding the patient's full course of treatment)  Past Medical History  Diagnosis Date  . Sleep apnea     cpap 6 yrs   Past Surgical History  Procedure Laterality Date  . Elbow surgery Bilateral 01 last    numerous lft , one right   History   Social History  . Marital Status: Married    Spouse Name: N/A    Number of Children: N/A  . Years of Education: N/A   Social History Main Topics  . Smoking status: Former Smoker -- 1.00 packs/day for 25 years    Quit date: 05/05/2005  . Smokeless tobacco: Current User  . Alcohol Use: No  . Drug Use: No  . Sexual Activity: Yes   Other Topics Concern  . None   Social History Narrative  . None   Family History  Problem Relation Age of Onset  . Cancer Sister     colon  . Sudden death Neg Hx   . Heart attack Neg Hx   . Diabetes Neg Hx   . Hyperlipidemia Mother   . Hypertension Mother   . Cancer Maternal Uncle   . Cancer Maternal Grandmother    No Known Allergies Prior to Admission medications   Medication Sig Start Date End Date Taking? Authorizing Provider  atorvastatin (LIPITOR) 40 MG tablet Take 40 mg by mouth daily. 01/04/14  Yes Monica Becton, MD  cetirizine (ZYRTEC) 10 MG tablet Take 10 mg by mouth daily.   Yes Historical Provider, MD  ranitidine (ZANTAC) 150 MG tablet Take 150 mg by mouth daily.   Yes Historical Provider, MD  traMADol (ULTRAM) 50 MG tablet Take 50 mg by mouth every 8 (eight) hours as needed for moderate pain. 04/16/14  Yes Monica Becton, MD     All other systems have been reviewed and were otherwise negative with the exception of those mentioned in the HPI  and as above.  Physical Exam: Filed Vitals:   05/13/14 0538  BP: 125/70  Pulse: 62  Temp: 98.1 F (36.7 C)  Resp: 20    General: Alert, no acute distress Cardiovascular: No pedal edema Respiratory: No cyanosis, no use of accessory musculature Skin: No lesions in the area of chief complaint Neurologic: Sensation intact distally Psychiatric: Patient is competent for consent with normal mood and affect Lymphatic: No axillary or cervical lymphadenopathy   Assessment/Plan: Bilateral leg pain Plan for Procedure(s): LUMBAR LAMINECTOMY/DECOMPRESSION MICRODISCECTOMY   Emilee Hero, MD 05/13/2014 6:58 AM

## 2014-05-17 ENCOUNTER — Encounter (HOSPITAL_COMMUNITY): Payer: Self-pay | Admitting: Orthopedic Surgery

## 2014-06-08 ENCOUNTER — Other Ambulatory Visit: Payer: Self-pay | Admitting: Sports Medicine

## 2014-06-24 ENCOUNTER — Encounter (INDEPENDENT_AMBULATORY_CARE_PROVIDER_SITE_OTHER): Payer: Self-pay

## 2014-06-24 ENCOUNTER — Ambulatory Visit (INDEPENDENT_AMBULATORY_CARE_PROVIDER_SITE_OTHER): Payer: BC Managed Care – PPO | Admitting: Physical Therapy

## 2014-06-24 DIAGNOSIS — M545 Low back pain, unspecified: Secondary | ICD-10-CM

## 2014-06-24 DIAGNOSIS — M6281 Muscle weakness (generalized): Secondary | ICD-10-CM

## 2014-06-24 DIAGNOSIS — R5381 Other malaise: Secondary | ICD-10-CM

## 2014-06-25 ENCOUNTER — Encounter: Payer: BC Managed Care – PPO | Admitting: Physical Therapy

## 2014-06-25 DIAGNOSIS — M545 Low back pain, unspecified: Secondary | ICD-10-CM

## 2014-06-25 DIAGNOSIS — R5381 Other malaise: Secondary | ICD-10-CM

## 2014-06-25 DIAGNOSIS — M6281 Muscle weakness (generalized): Secondary | ICD-10-CM

## 2014-06-28 ENCOUNTER — Encounter (INDEPENDENT_AMBULATORY_CARE_PROVIDER_SITE_OTHER): Payer: BC Managed Care – PPO | Admitting: Physical Therapy

## 2014-07-01 ENCOUNTER — Encounter (INDEPENDENT_AMBULATORY_CARE_PROVIDER_SITE_OTHER): Payer: BC Managed Care – PPO

## 2014-07-01 DIAGNOSIS — M6281 Muscle weakness (generalized): Secondary | ICD-10-CM

## 2014-07-01 DIAGNOSIS — M545 Low back pain, unspecified: Secondary | ICD-10-CM

## 2014-07-01 DIAGNOSIS — R5381 Other malaise: Secondary | ICD-10-CM

## 2014-07-05 ENCOUNTER — Encounter: Payer: BC Managed Care – PPO | Admitting: Physical Therapy

## 2014-07-09 DIAGNOSIS — G4710 Hypersomnia, unspecified: Secondary | ICD-10-CM | POA: Insufficient documentation

## 2014-07-09 DIAGNOSIS — G4733 Obstructive sleep apnea (adult) (pediatric): Secondary | ICD-10-CM | POA: Insufficient documentation

## 2014-07-12 ENCOUNTER — Encounter: Payer: BC Managed Care – PPO | Admitting: Physical Therapy

## 2014-07-14 ENCOUNTER — Encounter: Payer: BC Managed Care – PPO | Admitting: Physical Therapy

## 2014-07-16 ENCOUNTER — Encounter: Payer: BC Managed Care – PPO | Admitting: Physical Therapy

## 2014-07-22 ENCOUNTER — Ambulatory Visit (INDEPENDENT_AMBULATORY_CARE_PROVIDER_SITE_OTHER): Payer: BC Managed Care – PPO | Admitting: Physical Therapy

## 2014-07-22 DIAGNOSIS — M25559 Pain in unspecified hip: Secondary | ICD-10-CM

## 2014-07-22 DIAGNOSIS — M6281 Muscle weakness (generalized): Secondary | ICD-10-CM

## 2014-07-22 DIAGNOSIS — M545 Low back pain, unspecified: Secondary | ICD-10-CM

## 2014-07-26 ENCOUNTER — Encounter (INDEPENDENT_AMBULATORY_CARE_PROVIDER_SITE_OTHER): Payer: BC Managed Care – PPO

## 2014-07-26 DIAGNOSIS — M545 Low back pain, unspecified: Secondary | ICD-10-CM

## 2014-07-26 DIAGNOSIS — M25559 Pain in unspecified hip: Secondary | ICD-10-CM

## 2014-07-26 DIAGNOSIS — M6281 Muscle weakness (generalized): Secondary | ICD-10-CM

## 2014-07-29 ENCOUNTER — Encounter (INDEPENDENT_AMBULATORY_CARE_PROVIDER_SITE_OTHER): Payer: BC Managed Care – PPO

## 2014-07-29 DIAGNOSIS — M25559 Pain in unspecified hip: Secondary | ICD-10-CM

## 2014-07-29 DIAGNOSIS — M545 Low back pain, unspecified: Secondary | ICD-10-CM

## 2014-07-29 DIAGNOSIS — M6281 Muscle weakness (generalized): Secondary | ICD-10-CM

## 2014-08-02 ENCOUNTER — Encounter (INDEPENDENT_AMBULATORY_CARE_PROVIDER_SITE_OTHER): Payer: BC Managed Care – PPO | Admitting: Physical Therapy

## 2014-08-02 DIAGNOSIS — M545 Low back pain, unspecified: Secondary | ICD-10-CM

## 2014-08-02 DIAGNOSIS — M25559 Pain in unspecified hip: Secondary | ICD-10-CM

## 2014-08-02 DIAGNOSIS — M6281 Muscle weakness (generalized): Secondary | ICD-10-CM

## 2014-08-05 ENCOUNTER — Encounter (INDEPENDENT_AMBULATORY_CARE_PROVIDER_SITE_OTHER): Payer: BC Managed Care – PPO | Admitting: Physical Therapy

## 2014-08-05 DIAGNOSIS — M25559 Pain in unspecified hip: Secondary | ICD-10-CM

## 2014-08-05 DIAGNOSIS — M545 Low back pain, unspecified: Secondary | ICD-10-CM

## 2014-08-05 DIAGNOSIS — M6281 Muscle weakness (generalized): Secondary | ICD-10-CM

## 2014-08-10 ENCOUNTER — Encounter (INDEPENDENT_AMBULATORY_CARE_PROVIDER_SITE_OTHER): Payer: BC Managed Care – PPO | Admitting: Physical Therapy

## 2014-08-10 DIAGNOSIS — M545 Low back pain, unspecified: Secondary | ICD-10-CM

## 2014-08-10 DIAGNOSIS — M6281 Muscle weakness (generalized): Secondary | ICD-10-CM

## 2014-08-10 DIAGNOSIS — M25559 Pain in unspecified hip: Secondary | ICD-10-CM

## 2014-08-12 ENCOUNTER — Encounter (INDEPENDENT_AMBULATORY_CARE_PROVIDER_SITE_OTHER): Payer: BC Managed Care – PPO | Admitting: Physical Therapy

## 2014-08-12 DIAGNOSIS — M25559 Pain in unspecified hip: Secondary | ICD-10-CM

## 2014-08-12 DIAGNOSIS — M545 Low back pain, unspecified: Secondary | ICD-10-CM

## 2014-08-12 DIAGNOSIS — M6281 Muscle weakness (generalized): Secondary | ICD-10-CM

## 2014-08-16 ENCOUNTER — Encounter (INDEPENDENT_AMBULATORY_CARE_PROVIDER_SITE_OTHER): Payer: BC Managed Care – PPO | Admitting: Physical Therapy

## 2014-08-16 DIAGNOSIS — M25559 Pain in unspecified hip: Secondary | ICD-10-CM

## 2014-08-16 DIAGNOSIS — M545 Low back pain, unspecified: Secondary | ICD-10-CM

## 2014-08-16 DIAGNOSIS — M6281 Muscle weakness (generalized): Secondary | ICD-10-CM

## 2014-08-18 ENCOUNTER — Encounter: Payer: BC Managed Care – PPO | Admitting: Physical Therapy

## 2014-10-10 ENCOUNTER — Encounter: Payer: Self-pay | Admitting: Emergency Medicine

## 2014-10-10 ENCOUNTER — Emergency Department (INDEPENDENT_AMBULATORY_CARE_PROVIDER_SITE_OTHER)
Admission: EM | Admit: 2014-10-10 | Discharge: 2014-10-10 | Disposition: A | Discharge status: Home / Self Care | Payer: BC Managed Care – PPO | Source: Home / Self Care | Attending: Emergency Medicine | Admitting: Emergency Medicine

## 2014-10-10 DIAGNOSIS — L259 Unspecified contact dermatitis, unspecified cause: Secondary | ICD-10-CM

## 2014-10-10 MED ORDER — CETIRIZINE HCL 10 MG PO TABS: 10.0000 mg | ORAL_TABLET | Freq: Two times a day (BID) | ORAL | Status: DC

## 2014-10-10 MED ORDER — PREDNISONE (PAK) 10 MG PO TABS: ORAL_TABLET | ORAL | Status: DC

## 2014-10-10 MED ORDER — BETAMETHASONE DIPROPIONATE 0.05 % EX CREA: TOPICAL_CREAM | Freq: Two times a day (BID) | CUTANEOUS | Status: DC

## 2014-10-10 NOTE — ED Notes (Signed)
Reports noticing some bumps on skin up to 3 weeks ago; 2 days ago turned into more obvious rash on abdomen, trunk and legs which is itching. Concerned because a family member has shingles.

## 2014-10-10 NOTE — ED Provider Notes (Signed)
CSN: [id number]     Arrival date & time 10/10/14  1058 History   First MD Initiated Contact with Patient 10/10/14 1123     Chief Complaint  Patient presents with  . Rash   (Consider location/radiation/quality/duration/timing/severity/associated sxs/prior Treatment) HPI Reports noticing some very itchy bumps on skin up to 3 weeks ago; 2 days ago turned into more obvious rash on abdomen, trunk and legs which is itching. Concerned because a family member has shingles. This may have started right after working outside in the yard 3 weeks ago. Recalls no definite allergens.  Positive itch. Benadryl by mouth help, but causes drowsiness. Denies pain or fever or chills. Otherwise feels well. No chest pain or shortness of breath or ENT symptoms. Past Medical History  Diagnosis Date  . Sleep apnea     cpap 6 yrs   Past Surgical History  Procedure Laterality Date  . Elbow surgery Bilateral 01 last    numerous lft , one right  . Lumbar laminectomy/decompression microdiscectomy N/A 05/13/2014    Procedure: LUMBAR LAMINECTOMY/DECOMPRESSION MICRODISCECTOMY;  Surgeon: Emilee HeroMark Leonard Dumonski, MD;  [facility];  Service: Orthopedics;  Laterality: N/A;  Lumbar 3-4 decompression   Family History  Problem Relation Age of Onset  . Cancer Sister     colon  . Sudden death Neg Hx   . Heart attack Neg Hx   . Diabetes Neg Hx   . Hyperlipidemia Mother   . Hypertension Mother   . Cancer Maternal Uncle   . Cancer Maternal Grandmother    History  Substance Use Topics  . Smoking status: Former Smoker -- 1.00 packs/day for 25 years    Quit date: 05/05/2005  . Smokeless tobacco: Current User  . Alcohol Use: No    Review of Systems  All other systems reviewed and are negative.   Allergies  Review of patient's allergies indicates no known allergies.  Home Medications   Prior to Admission medications   Medication Sig Start Date End Date Taking? Authorizing Provider  atorvastatin (LIPITOR) 40 MG  tablet Take 40 mg by mouth daily. 01/04/14   Monica Bectonhomas J Thekkekandam, MD  atorvastatin (LIPITOR) 40 MG tablet TAKE 1 TABLET BY MOUTH DAILY. 06/08/14   Monica Bectonhomas J Thekkekandam, MD  betamethasone dipropionate (DIPROLENE) 0.05 % cream Apply topically 2 (two) times daily. 10/10/14   Lajean Manesavid Massey, MD  cetirizine (ZYRTEC) 10 MG tablet Take 1 tablet (10 mg total) by mouth 2 (two) times daily. For itch 10/10/14   Lajean Manesavid Massey, MD  predniSONE (STERAPRED UNI-PAK) 10 MG tablet Take as directed for 6 days.--Take 6 on day 1, 5 on day 2, 4 on day 3, then 3 tablets on day 4, then 2 tablets on day 5, then 1 on day 6. 10/10/14   Lajean Manesavid Massey, MD  ranitidine (ZANTAC) 150 MG tablet Take 150 mg by mouth daily.    Historical Provider, MD   BP 117/77 mmHg  Pulse 67  Temp(Src) 98.4 F (36.9 C) (Oral)  Resp 16  Ht 5\' 10"  (1.778 m)  Wt 280 lb (127.007 kg)  BMI 40.18 kg/m2  SpO2 96% Physical Exam  Constitutional: He is oriented to person, place, and time. He appears well-developed and well-nourished. No distress.  HENT:  Head: Normocephalic and atraumatic.  Eyes: Conjunctivae and EOM are normal. Pupils are equal, round, and reactive to light. No scleral icterus.  Neck: Normal range of motion.  Cardiovascular: Normal rate.   Pulmonary/Chest: Effort normal.  Abdominal: He exhibits no distension.  Musculoskeletal: Normal  range of motion.  Neurological: He is alert and oriented to person, place, and time.  Skin: Skin is warm. Rash noted.  Psychiatric: He has a normal mood and affect.  Nursing note and vitals reviewed.  Skin: few areas of small patches of papular rash in clusters on abdomen, trunks and legs bilaterally. No definite pustules  or sign of infection. No fluctuance. ED Course  Procedures (including critical care time) Labs Review Labs Reviewed - No data to display  Imaging Review No results found.   MDM   1. Contact dermatitis    Treatment options discussed, as well as risks, benefits,  alternatives. Patient and wife voiced understanding and agreement with the following plans: Continue with oral Benadryl at night for itch.--Don't take during the daytime. Discharge Medication List as of 10/10/2014 12:15 PM    START taking these medications   Details  betamethasone dipropionate (DIPROLENE) 0.05 % cream Apply topically 2 (two) times daily., Starting 10/10/2014, Until Discontinued, Normal    predniSONE (STERAPRED UNI-PAK) 10 MG tablet Take as directed for 6 days.--Take 6 on day 1, 5 on day 2, 4 on day 3, then 3 tablets on day 4, then 2 tablets on day 5, then 1 on day 6., Print       Zyrtec 10 mg by mouth twice a day for itch. Gave them the option of holding onto the prednisone prescription and not filling unless he is worse over the next day or 2. Follow-up with your dermatologist or primary care doctor in 5-7 days if not improving, or sooner if symptoms become worse. Precautions discussed. Red flags discussed. Questions invited and answered. Patient and wife voiced understanding and agreement.      Lajean Manesavid Massey, MD 10/10/14 [phone]

## 2014-11-01 ENCOUNTER — Ambulatory Visit (INDEPENDENT_AMBULATORY_CARE_PROVIDER_SITE_OTHER): Payer: BC Managed Care – PPO | Admitting: Physician Assistant

## 2014-11-01 ENCOUNTER — Encounter: Payer: Self-pay | Admitting: Physician Assistant

## 2014-11-01 VITALS — BP 122/81 | HR 66 | Ht 70.0 in | Wt 286.0 lb

## 2014-11-01 DIAGNOSIS — Z23 Encounter for immunization: Secondary | ICD-10-CM

## 2014-11-01 DIAGNOSIS — E669 Obesity, unspecified: Secondary | ICD-10-CM | POA: Diagnosis not present

## 2014-11-01 DIAGNOSIS — L739 Follicular disorder, unspecified: Secondary | ICD-10-CM

## 2014-11-01 MED ORDER — PHENTERMINE HCL 37.5 MG PO TABS: 37.5000 mg | ORAL_TABLET | Freq: Every day | ORAL | Status: DC

## 2014-11-01 MED ORDER — DOXYCYCLINE HYCLATE 100 MG PO TABS: 100.0000 mg | ORAL_TABLET | Freq: Two times a day (BID) | ORAL | Status: DC

## 2014-11-01 MED ORDER — CEPHALEXIN 500 MG PO CAPS: 500.0000 mg | ORAL_CAPSULE | Freq: Two times a day (BID) | ORAL | Status: DC

## 2014-11-01 MED ORDER — MUPIROCIN 2 % EX OINT: TOPICAL_OINTMENT | CUTANEOUS | Status: DC

## 2014-11-01 NOTE — Progress Notes (Signed)
   Subjective:    Patient ID: Bobby Saunders, male    DOB: 03/14/1966, 48 y.o.   MRN: [id number]  HPI Pt presents to the clinic with rash on abdomen. He was seen by urgent care on 11/8 and treated for contact dermatitis. Some of redness did resolve but spots remained. Some of the lesions seem like they are getting redder and have pus in them. Denies any itching. When touched they are tender. Only took prednisone pack and used steroid cream. No fever, chills, n/v/d. No hot tub usage.   Pt has gained a lot of weight over past year. Would like to try phentermine again. Tried in past and had great response. Admits he does not currently exercise or watching diet.    Review of Systems  All other systems reviewed and are negative.      Objective:   Physical Exam  Constitutional: He is oriented to person, place, and time. He appears well-developed and well-nourished.  Obese.   HENT:  Head: Normocephalic and atraumatic.  Cardiovascular: Normal rate, regular rhythm and normal heart sounds.   Pulmonary/Chest: Effort normal and breath sounds normal. He has no wheezes.  Neurological: He is alert and oriented to person, place, and time.  Skin: Skin is dry.  Pustules over abdomen with surrounding erythema.   Psychiatric: He has a normal mood and affect. His behavior is normal.          Assessment & Plan:  Folliculitis- gave HO. Doxycycline given with bactroban to use up to three times a day as needed for next 7 days. Follow up if not resolving.   Obese- diet and exercise plan discussed. Phentermine given for daily use. Pt has been on before. Discussed side effects. Follow up with PCP in 1 month.   Flu shot given without complications.

## 2014-11-01 NOTE — Patient Instructions (Signed)

## 2014-12-01 ENCOUNTER — Ambulatory Visit: Payer: BC Managed Care – PPO | Admitting: Sports Medicine

## 2015-03-20 ENCOUNTER — Emergency Department (INDEPENDENT_AMBULATORY_CARE_PROVIDER_SITE_OTHER): Payer: BLUE CROSS/BLUE SHIELD

## 2015-03-20 ENCOUNTER — Emergency Department
Admission: EM | Admit: 2015-03-20 | Discharge: 2015-03-20 | Disposition: A | Discharge status: Home / Self Care | Payer: BLUE CROSS/BLUE SHIELD | Source: Home / Self Care | Attending: Family Medicine | Admitting: Family Medicine

## 2015-03-20 ENCOUNTER — Encounter: Payer: Self-pay | Admitting: Emergency Medicine

## 2015-03-20 DIAGNOSIS — M7531 Calcific tendinitis of right shoulder: Secondary | ICD-10-CM

## 2015-03-20 DIAGNOSIS — M7501 Adhesive capsulitis of right shoulder: Secondary | ICD-10-CM | POA: Diagnosis not present

## 2015-03-20 MED ORDER — HYDROCODONE-ACETAMINOPHEN 10-325 MG PO TABS: 1.0000 | ORAL_TABLET | Freq: Four times a day (QID) | ORAL | Status: DC | PRN

## 2015-03-20 MED ORDER — KETOROLAC TROMETHAMINE 60 MG/2ML IM SOLN
60.0000 mg | Freq: Once | INTRAMUSCULAR | Status: AC
  Administered 2015-03-20: 60 mg via INTRAMUSCULAR

## 2015-03-20 MED ORDER — MELOXICAM 15 MG PO TABS: 15.0000 mg | ORAL_TABLET | Freq: Every day | ORAL | Status: DC

## 2015-03-20 NOTE — Discharge Instructions (Signed)
Apply ice pack for 20 to 30 minutes, 3 to 4 times daily  Continue until pain decreases.  Wear sling.  May do pendulum exercises when tolerated.   Adhesive Capsulitis Sometimes the shoulder becomes stiff and is painful to move. Some people say it feels as if the shoulder is frozen in place. Because of this, the condition is called "frozen shoulder." Its medical name is adhesive capsulitis.  The shoulder joint is made up of strong connective tissue that attaches the ball of the humerus to the shallow shoulder socket. This strong connective tissue is called the joint capsule. This tissue can become stiff and swollen. That is when adhesive capsulitis sets in. CAUSES  It is not always clear just what the cause adhesive capsulitis. Possibilities include:  Injury to the shoulder joint.  Strain. This is a repetitive injury brought about by overuse.  Lack of use. Perhaps your arm or hand was otherwise injured. It might have been in a sling for awhile. Or perhaps you were not using it to avoid pain.  Referred pain. This is a sort of trick the body plays. You feel pain in the shoulder. But, the pain actually comes from an injury somewhere else in the body.  Long-standing health problems. Several diseases can cause adhesive capsulitis. They include diabetes, heart disease, stroke, thyroid problems, rheumatoid arthritis and lung disease.  Being a women older than 40. Anyone can develop adhesive capsulitis but it is most common in women in this age group. SYMPTOMS   Pain.  It occurs when the arm is moved.  Parts of the shoulder might hurt if they are touched.  Pain is worse at night or when resting.  Soreness. It might not be strong enough to be called pain. But, the shoulder aches.  The shoulder does not move freely.  Muscle spasms.  Trouble sleeping because of shoulder ache or pain. DIAGNOSIS  To decide if you have adhesive capsulitis, your healthcare provider will probably:  Ask about  symptoms you have noticed.  Ask about your history of joint pain and anything that might have caused the pain.  Ask about your overall health.  Use hands to feel your shoulder and neck.  Ask you to move your shoulder in specific directions. This may indicate the origin of the pain.  Order imaging tests; pictures of the shoulder. They help pinpoint the source of the problem. An X-ray might be used. For more detail, an MRI is often used. An MRI details the tendons, muscles and ligaments as well as the joint. TREATMENT  Adhesive capsulitis can be treated several ways. Most treatments can be done in a clinic or in your healthcare provider's office. Be sure to discuss the different options with your caregiver. They include:  Physical therapy. You will work on specific exercises to get your shoulder moving again. The exercises usually involve stretching. A physical therapist (a caregiver with special training) can show you what to do and what not to do. The exercises will need to be done daily.  Medication.  Over-the-counter medicines may relieve pain and inflammation (the body's way of reacting to injury or infection).  Corticosteroids. These are stronger drugs to reduce pain and inflammation. They are given by injection (shots) into the shoulder joint. Frequent treatment is not recommended.  Muscle relaxants. Medication may be prescribed to ease muscle spasms.  Treatment of underlying conditions. This means treating another condition that is causing your shoulder problem. This might be a rotator cuff (tendon) problem  Shoulder  manipulation. The shoulder will be moved by your healthcare provider. You would be under general anesthesia (given a drug that puts you to sleep). You would not feel anything. Sometimes the joint will be injected with salt water (saline) at high pressure to break down internal scarring in the joint capsule.  Surgery. This is rarely needed. It may be suggested in  advanced cases after all other treatment has failed. PROGNOSIS  In time, most people recover from adhesive capsulitis. Sometimes, however, the pain goes away but full movement of the shoulder does not return.  HOME CARE INSTRUCTIONS   Take any pain medications recommended by your healthcare provider. Follow the directions carefully.  If you have physical therapy, follow through with the therapist's suggestions. Be sure you understand the exercises you will be doing. You should understand:  How often the exercises should be done.  How many times each exercise should be repeated.  How long they should be done.  What other activities you should do, or not do.  That you should warm up before doing any exercise. Just 5 to 10 minutes will help. Small, gentle movements should get your shoulder ready for more.  Avoid high-demand exercise that involves your shoulder such as throwing. This type of exercise can make pain worse.  Consider using cold packs. Cold may ease swelling and pain. Ask your healthcare provider if a cold pack might help you. If so, get directions on how and when to use them. SEEK MEDICAL CARE IF:   You have any questions about your medications.  Your pain continues to increase. Document Released: 09/16/2009 Document Revised: 02/11/2012 Document Reviewed: 09/16/2009 [facility] Patient Information 2015 [place], [place]. This information is not intended to replace advice given to you by your health care provider. Make sure you discuss any questions you have with your health care provider.

## 2015-03-20 NOTE — ED Notes (Signed)
Patient took hydrocodone at 0130 today without relief.

## 2015-03-20 NOTE — ED Provider Notes (Signed)
CSN: [id number]     Arrival date & time 03/20/15  1120 History   First MD Initiated Contact with Patient 03/20/15 1210     Chief Complaint  Patient presents with  . Shoulder Pain      HPI Comments: Patient developed non-disabling pain in his right shoulder about one month ago without preceding injury or change in activities.  He works for [employer] and lifts heavy items frequently.  Yesterday the pain suddenly became quite severe and he is now unable to use his right shoulder.  Patient is a 49 y.o. male presenting with shoulder pain. The history is provided by the patient and the spouse.  Shoulder Pain Location:  Shoulder Time since incident:  1 month Injury: no   Shoulder location:  R shoulder Pain details:    Quality:  Sharp   Radiates to:  Does not radiate   Severity:  Severe   Onset quality:  Gradual   Duration:  1 month   Timing:  Constant   Progression:  Worsening Chronicity:  New Handedness:  Right-handed Dislocation: no   Prior injury to area:  No Relieved by:  Nothing Worsened by:  Movement Ineffective treatments:  Narcotics Associated symptoms: decreased range of motion and stiffness   Associated symptoms: no back pain, no fatigue, no fever, no muscle weakness, no neck pain, no numbness, no swelling and no tingling     Past Medical History  Diagnosis Date  . Sleep apnea     cpap 6 yrs   Past Surgical History  Procedure Laterality Date  . Elbow surgery Bilateral 01 last    numerous lft , one right  . Lumbar laminectomy/decompression microdiscectomy N/A 05/13/2014    Procedure: LUMBAR LAMINECTOMY/DECOMPRESSION MICRODISCECTOMY;  Surgeon: Emilee Hero, MD;  Location: [facility] OR;  Service: Orthopedics;  Laterality: N/A;  Lumbar 3-4 decompression   Family History  Problem Relation Age of Onset  . Cancer Sister     colon  . Sudden death Neg Hx   . Heart attack Neg Hx   . Diabetes Neg Hx   . Hyperlipidemia Mother   . Hypertension Mother   . Cancer Maternal  Uncle   . Cancer Maternal Grandmother    History  Substance Use Topics  . Smoking status: Former Smoker -- 1.00 packs/day for 25 years    Quit date: 05/05/2005  . Smokeless tobacco: Current User  . Alcohol Use: No    Review of Systems  Constitutional: Negative.  Negative for fever and fatigue.  Musculoskeletal: Positive for stiffness. Negative for back pain and neck pain.  All other systems reviewed and are negative.   Allergies  Review of patient's allergies indicates no known allergies.  Home Medications   Prior to Admission medications   Medication Sig Start Date End Date Taking? Authorizing Provider  atorvastatin (LIPITOR) 40 MG tablet TAKE 1 TABLET BY MOUTH DAILY. 06/08/14   Monica Becton, MD  betamethasone dipropionate (DIPROLENE) 0.05 % cream Apply topically 2 (two) times daily. Patient not taking: Reported on 11/01/2014 10/10/14   Lajean Manes, MD  HYDROcodone-acetaminophen [facility]) 10-325 MG per tablet Take 1 tablet by mouth every 6 (six) hours as needed. 03/20/15   Lattie Haw, MD  meloxicam (MOBIC) 15 MG tablet Take 1 tablet (15 mg total) by mouth daily. Take with food each evening. 03/20/15   Lattie Haw, MD  mupirocin ointment (BACTROBAN) 2 % Apply to affected area TID for 7 days. 11/01/14   Jade L Breeback, PA-C  phentermine (  ADIPEX-P) 37.5 MG tablet Take 1 tablet (37.5 mg total) by mouth daily before breakfast. 11/01/14   Jade L Breeback, PA-C  ranitidine (ZANTAC) 150 MG tablet Take 150 mg by mouth daily.    Historical Provider, MD   BP 115/75 mmHg  Pulse 55  Temp(Src) 97.6 F (36.4 C) (Oral)  Resp 16  Ht 5\' 10"  (1.778 m)  Wt 267 lb (121.11 kg)  BMI 38.31 kg/m2  SpO2 98% Physical Exam  Constitutional: He is oriented to person, place, and time. He appears well-developed and well-nourished. No distress.  Patient is obese (BMI 38.3)  HENT:  Head: Normocephalic.  Nose: Nose normal.  Mouth/Throat: Oropharynx is clear and moist.  Eyes: Pupils are  equal, round, and reactive to light.  Neck: Normal range of motion. Neck supple.  Cardiovascular: Normal heart sounds.   Pulmonary/Chest: Breath sounds normal.  Musculoskeletal:       Right shoulder: He exhibits decreased range of motion, tenderness, pain and decreased strength. He exhibits no bony tenderness, no swelling, no effusion, no crepitus, no deformity and normal pulse.       Arms: Right shoulder has markedly decreased range of motion, both active and passive.  Tenderness over long head of biceps and lateral to acromion.  Unable to perform Apley's or empty can test.  Distal neurovascular function is intact.   Lymphadenopathy:    He has no cervical adenopathy.  Neurological: He is alert and oriented to person, place, and time.  Skin: Skin is warm and dry. No rash noted.  Nursing note and vitals reviewed.   ED Course  Procedures  none  Imaging Review Dg Shoulder Right  03/20/2015   CLINICAL DATA:  Right shoulder pain for the past month. No known injury.  EXAM: RIGHT SHOULDER - 2+ VIEW  COMPARISON:  None.  FINDINGS: There are small, oval, corticated calcific densities projected anterior to the humeral head. Otherwise, normal appearing bones and soft tissues.  IMPRESSION: Calcific tendinitis or small loose bodies anterior to the humeral head.   Electronically Signed   By: Beckie SaltsSteven  Reid M.D.   On: 03/20/2015 12:00     MDM   1. Bursitis of shoulder, adhesive, right    Toradol 60mg  IM.  Begin Mobic 15mg  daily.  Norco for pain. Fitted with sling immobilizer. Apply ice pack for 20 to 30 minutes, 3 to 4 times daily  Continue until pain decreases.  Wear sling.  May do pendulum exercises when tolerated. Followup with Dr. Pearletha ForgeHudnall as soon as possible.     Lattie HawStephen A Auset Fritzler, MD 03/21/15 [phone]

## 2015-03-20 NOTE — ED Notes (Signed)
Reports onset of right shoulder pain/aching about 4 weeks ago; no known injury but lifts heavy items for [employer]; yesterday pain became very severe and he is guarding arm/shoulder today.

## 2015-03-21 ENCOUNTER — Encounter: Payer: Self-pay | Admitting: Family Medicine

## 2015-03-21 ENCOUNTER — Ambulatory Visit (INDEPENDENT_AMBULATORY_CARE_PROVIDER_SITE_OTHER): Payer: BLUE CROSS/BLUE SHIELD | Admitting: Family Medicine

## 2015-03-21 VITALS — BP 120/70 | HR 57 | Ht 70.0 in | Wt 268.0 lb

## 2015-03-21 DIAGNOSIS — M25511 Pain in right shoulder: Secondary | ICD-10-CM | POA: Diagnosis not present

## 2015-03-21 MED ORDER — HYDROCODONE-ACETAMINOPHEN 10-325 MG PO TABS: 1.0000 | ORAL_TABLET | Freq: Four times a day (QID) | ORAL | Status: DC | PRN

## 2015-03-21 MED ORDER — METHYLPREDNISOLONE ACETATE 40 MG/ML IJ SUSP
40.0000 mg | Freq: Once | INTRAMUSCULAR | Status: AC
  Administered 2015-03-21: 40 mg via INTRA_ARTICULAR

## 2015-03-21 NOTE — Progress Notes (Signed)
PCP: Rodney LangtonHEKKEKANDAM, THOMAS, MD  Subjective:   HPI: Patient is a 49 y.o. male here for right shoulder pain.  Patient reports he has had right shoulder pain without an injury for the past month. 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 More sore that night and by Saturday morning had a lot of pain in the right shoulder. Radiographs at urgent care showed calcific tendinitis vs loose bodies. No prior injuries to this shoulder. Right handed. Using sling which helps a lot. + night pain. No numbness/tingling.  Past Medical History  Diagnosis Date  . Sleep apnea     cpap 6 yrs    Current Outpatient Prescriptions on File Prior to Visit  Medication Sig Dispense Refill  . atorvastatin (LIPITOR) 40 MG tablet TAKE 1 TABLET BY MOUTH DAILY. 90 tablet 1  . betamethasone dipropionate (DIPROLENE) 0.05 % cream Apply topically 2 (two) times daily. (Patient not taking: Reported on 11/01/2014) 30 g 1  . meloxicam (MOBIC) 15 MG tablet Take 1 tablet (15 mg total) by mouth daily. Take with food each evening. 15 tablet 0  . mupirocin ointment (BACTROBAN) 2 % Apply to affected area TID for 7 days. 30 g 3  . phentermine (ADIPEX-P) 37.5 MG tablet Take 1 tablet (37.5 mg total) by mouth daily before breakfast. 30 tablet 0  . ranitidine (ZANTAC) 150 MG tablet Take 150 mg by mouth daily.     No current facility-administered medications on file prior to visit.    Past Surgical History  Procedure Laterality Date  . Elbow surgery Bilateral 01 last    numerous lft , one right  . Lumbar laminectomy/decompression microdiscectomy N/A 05/13/2014    Procedure: LUMBAR LAMINECTOMY/DECOMPRESSION MICRODISCECTOMY;  Surgeon: Emilee HeroMark Leonard Dumonski, MD;  [facility];  Service: Orthopedics;  Laterality: N/A;  Lumbar 3-4 decompression    No Known Allergies  History    Social History  . Marital Status: Married    Spouse Name: N/A  . Number of Children: N/A  . Years of Education: N/A   Occupational History  . Not on file.   Social History Main Topics  . Smoking status: Former Smoker -- 1.00 packs/day for 25 years    Quit date: 05/05/2005  . Smokeless tobacco: Current User  . Alcohol Use: No  . Drug Use: No  . Sexual Activity: Yes   Other Topics Concern  . Not on file   Social History Narrative    Family History  Problem Relation Age of Onset  . Cancer Sister     colon  . Sudden death Neg Hx   . Heart attack Neg Hx   . Diabetes Neg Hx   . Hyperlipidemia Mother   . Hypertension Mother   . Cancer Maternal Uncle   . Cancer Maternal Grandmother     BP 120/70 mmHg  Pulse 57  Ht 5\' 10"  (1.778 m)  Wt 268 lb (121.564 kg)  BMI 38.45 kg/m2  Review of Systems: See HPI above.    Objective:  Physical Exam:  Gen: NAD, wearing sling.  Right shoulder: No swelling, ecchymoses.  No gross deformity. TTP anterior shoulder joint and near RTC insertion. Very limited motion due to pain.. Unable to perform special tests due to pain. NV intact distally.    Assessment & Plan:  1. Right shoulder pain - severe, quick  onset without obvious injury - possible twinge on Friday was followed by full strength and motion through the entire day - very unlikely he has a partial or full thickness rotator cuff tear.  Unable to position for MSK u/s due to pain also.  Calcific tendinitis commonly presents this way and is very responsive to steroid injection in most cases - went ahead with this today.  Out of work.  Sling as needed.  Shown codman exercises to do daily.  Call us in a week to let us know how he's doing.  After informed written consent, patient was seated on exam table. Right shoulder was prepped with alcohol swab and utilizing posterior approach, patient's right subacromial space was injected with 3:1 marcaine: depomedrol. Patient tolerated the  procedure well without immediate complications.

## 2015-03-21 NOTE — Patient Instructions (Addendum)
You have a severe calcific rotator cuff tendinitis. A cortisone shot is usually extremely effective for this and was given today. Call me in a week to let me know how you're doing. Ibuprofen or aleve as needed for pain and inflammation. Norco as needed for severe pain. Use sling as needed. When possible start the arm circles, pendulums, table slides - 3 sets of 10 once or twice a day. Out of work for next week.

## 2015-03-21 NOTE — Assessment & Plan Note (Signed)
severe, quick onset without obvious injury - possible twinge on Friday was followed by full strength and motion through the entire day - very unlikely he has a partial or full thickness rotator cuff tear.  Unable to position for MSK u/s due to pain also.  Calcific tendinitis commonly presents this way and is very responsive to steroid injection in most cases - went ahead with this today.  Out of work.  Sling as needed.  Shown codman exercises to do daily.  Call us in a week to let us know how he's doing.  After informed written consent, patient was seated on exam table. Right shoulder was prepped with alcohol swab and utilizing posterior approach, patient's right subacromial space was injected with 3:1 marcaine: depomedrol. Patient tolerated the procedure well without immediate complications.

## 2015-08-23 ENCOUNTER — Encounter: Payer: Self-pay | Admitting: Sports Medicine

## 2015-08-23 ENCOUNTER — Ambulatory Visit (INDEPENDENT_AMBULATORY_CARE_PROVIDER_SITE_OTHER): Payer: BLUE CROSS/BLUE SHIELD | Admitting: Sports Medicine

## 2015-08-23 VITALS — BP 131/86 | HR 69 | Wt 272.0 lb

## 2015-08-23 DIAGNOSIS — S86811S Strain of other muscle(s) and tendon(s) at lower leg level, right leg, sequela: Secondary | ICD-10-CM | POA: Diagnosis not present

## 2015-08-23 DIAGNOSIS — L918 Other hypertrophic disorders of the skin: Secondary | ICD-10-CM

## 2015-08-23 DIAGNOSIS — S86819A Strain of other muscle(s) and tendon(s) at lower leg level, unspecified leg, initial encounter: Secondary | ICD-10-CM | POA: Insufficient documentation

## 2015-08-23 NOTE — Progress Notes (Signed)
  Subjective:    CC: Skin tag  HPI: This pleasant 49 year old male is here with a fairly large skin tag on his right lower abdomen, it tends to catch on things, and cause minimal pain, persistent. He would like surgical excision today.  He also endorses a several month history of pain along the gastrocnemius, medial head, right.  Past medical history, Surgical history, Family history not pertinant except as noted below, Social history, Allergies, and medications have been entered into the medical record, reviewed, and no changes needed.   Review of Systems: No fevers, chills, night sweats, weight loss, chest pain, or shortness of breath.   Objective:    General: Well Developed, well nourished, and in no acute distress.  Neuro: Alert and oriented x3, extra-ocular muscles intact, sensation grossly intact.  HEENT: Normocephalic, atraumatic, pupils equal round reactive to light, neck supple, no masses, no lymphadenopathy, thyroid nonpalpable.  Skin: Warm and dry, no rashes. There is a 1.5 cm skin tag on the right lower abdomen, pedunculated. Cardiac: Regular rate and rhythm, no murmurs rubs or gallops, no lower extremity edema.  Respiratory: Clear to auscultation bilaterally. Not using accessory muscles, speaking in full sentences. Right lower extremity: tender palpation along the muscle belly of the medial head of the gastrocnemius. Negative Homans sign bilaterally, no visible swelling. Negative Thompson's test bilaterally.  Procedure:  Removal of right lower abdominal skin tag. Risks, benefits, alternatives explained to patient. Consent obtained. Time out conducted. Noted no overlying induration or erythema at site of injection. A small amount of lidocaine with epinephrine infiltrated under the skin tag(s) for local anesthesia. Hemostat used to clamp the neck of the skin tag(s). Scalpel then used to excise the skin tag(s), and subsequent electrocautery with a Hyfrecator used to control  minor bleeding. Antibiotic ointment applied. Wound dressed. Advised to return if increased redness, swelling, drainage, fevers, or chills.  Impression and Recommendations:    I spent 25 minutes with this patient, greater than 50% was face-to-face time counseling regarding the above diagnoses, time spent was separate from the time spent during the procedure.

## 2015-08-23 NOTE — Assessment & Plan Note (Signed)
Surgical removal with hyfrecation.

## 2015-08-23 NOTE — Assessment & Plan Note (Signed)
Heel lift, formal physical therapy. Return if no better in one month for intramuscular injection.

## 2015-08-31 ENCOUNTER — Encounter: Payer: Self-pay | Admitting: Physical Therapy

## 2015-08-31 ENCOUNTER — Ambulatory Visit (INDEPENDENT_AMBULATORY_CARE_PROVIDER_SITE_OTHER): Payer: BLUE CROSS/BLUE SHIELD | Admitting: Physical Therapy

## 2015-08-31 DIAGNOSIS — M25561 Pain in right knee: Secondary | ICD-10-CM | POA: Diagnosis not present

## 2015-08-31 DIAGNOSIS — M25671 Stiffness of right ankle, not elsewhere classified: Secondary | ICD-10-CM

## 2015-08-31 NOTE — Therapy (Signed)
[facility] [place] [address] [place], [place], [address] Phone: [phone]   Fax:  [phone]  Physical Therapy Evaluation  Patient Details  Name: Bobby Saunders MRN: [id number] Date of Birth: March 29, 1966 Referring Provider:  Monica Becton,*  Encounter Date: 08/31/2015      PT End of Session - 08/31/15 0826    Visit Number 1   Number of Visits 6   Date for PT Re-Evaluation 09/28/15   PT Start Time 0733   PT Stop Time 0817   PT Time Calculation (min) 44 min   Activity Tolerance Patient tolerated treatment well      Past Medical History  Diagnosis Date  . Sleep apnea     cpap 6 yrs    Past Surgical History  Procedure Laterality Date  . Elbow surgery Bilateral 01 last    numerous lft , one right  . Lumbar laminectomy/decompression microdiscectomy N/A 05/13/2014    Procedure: LUMBAR LAMINECTOMY/DECOMPRESSION MICRODISCECTOMY;  Surgeon: Emilee Hero, MD;  Location: [facility] OR;  Service: Orthopedics;  Laterality: N/A;  Lumbar 3-4 decompression    There were no vitals filed for this visit.  Visit Diagnosis:  Pain in joint, lower leg, right - Plan: PT plan of care cert/re-cert  Stiffness of ankle joint, right - Plan: PT plan of care cert/re-cert      Subjective Assessment - 08/31/15 0736    Subjective Pt reports he developed calf pain when climbing a ladder, felt a wierd sensation like being hit in the calf.  5 wks ago.  Unable to walk initially then had trouble weightbearing for a couple weeks. Sustaining a small tear in the calf. Was denied by workers comp.    How long can you sit comfortably? unable to sit in position of choice due to pain with pressure on the calf.   How long can you stand comfortably? no limitation   How long can you walk comfortably? no limitation   Diagnostic tests x-ray 8/24 (-) for fx shows a tear.    Currently in Pain? No/denies  touching the area and squating down hurts the  calf. Rt            [facility] PT Assessment - 08/31/15 0001    Assessment   Medical Diagnosis Rt calf strain   Onset Date/Surgical Date 07/27/15   Next MD Visit not scheduled yet   Prior Therapy not for this    Precautions   Precautions None   Balance Screen   Has the patient fallen in the past 6 months No   Has the patient had a decrease in activity level because of a fear of falling?  No   Is the patient reluctant to leave their home because of a fear of falling?  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[employer], delivery   Leisure Surveyor, quantity and shows   Observation/Other Assessments   Focus on Therapeutic Outcomes (FOTO)  30% limited   ROM / Strength   AROM / PROM / Strength AROM;Strength;PROM   AROM   AROM Assessment Site Ankle   Right/Left Ankle Left;Right   Right Ankle Dorsiflexion -2   Left Ankle Dorsiflexion 8   PROM   PROM Assessment Site Ankle   Right/Left Ankle Left;Right   Right Ankle  Dorsiflexion 3   Left Ankle Dorsiflexion 12   Strength   Overall Strength Comments bilat LE's WNL   Flexibility   Soft Tissue Assessment /Muscle Length --  tightness in Rt calf   Palpation   Palpation comment good posterior talus Rt , very tender and tight distal Rt gastroc right above tendious junctions.    Special Tests    Special Tests --  Single leg stance WNL                   [facility] Adult PT Treatment/Exercise - 08/31/15 0001    Self-Care   Self-Care Scar Mobilizations  using a ball   Exercises   Exercises Ankle  gastroc stretch x2    Modalities   Modalities Ultrasound   Ultrasound   Ultrasound Location Rt gastroc distally   Ultrasound Parameters 100%, 1.67mHz, 1.5 w/cm2   Ultrasound Goals Pain  scar tissue   Manual Therapy   Manual Therapy Soft tissue  mobilization   Soft tissue mobilization to Rt calf, cross friction massage. TPR                PT Education - 08/31/15 0753    Education provided Yes   Education Details gastroc stretch and ball releases   Person(s) Educated Patient   Methods Explanation;Demonstration;Handout   Comprehension Returned demonstration             PT Long Term Goals - 08/31/15 0830    PT LONG TERM GOAL #1   Title I with HEP ( 09/28/15)    Time 4   Period Weeks   Status New   PT LONG TERM GOAL #2   Title increase Rt ankle dosiflexion =/> 12 degrees ( 09/18/15)    Time 4   Period Weeks   Status New   PT LONG TERM GOAL #3   Title report pain decrease =/> 75% with squating. ( 09/28/15)    Time 4   Period Weeks   Status New   PT LONG TERM GOAL #4   Title improve FOTO =/< 21% limited ( 09/28/15)    Time 4   Period Weeks   Status New               Plan - 08/31/15 0827    Clinical Impression Statement 49 yo male presents 5 wks s/p Rt calf strain.  He continues to have deep pain in his calf with pressure and sqauting.  He has decreased dorsiflexion on that side, no limitations in strength.  His job is very physical and he has to squat multiple times a day.    Pt will benefit from skilled therapeutic intervention in order to improve on the following deficits Pain;Increased muscle spasms;Decreased range of motion   Rehab Potential Excellent   PT Frequency 2x / week  1-2x /wk   PT Duration 4 weeks   PT Treatment/Interventions Manual techniques;Therapeutic exercise;Moist Heat;Taping;Electrical Stimulation;Cryotherapy;Dry needling;Passive range of motion;Patient/family education;Ultrasound   PT Next Visit Plan dry needling to Rt calf   Consulted and Agree with Plan of Care Patient         Problem List Patient Active Problem List   Diagnosis Date Noted  . Strain of calf muscle 08/23/2015  . Right shoulder pain 03/21/2015  . Difficulty staying awake 07/09/2014  . Obstructive  apnea 07/09/2014  . Skin tag 01/29/2013  . Acne 01/29/2013  . Hyperlipidemia LDL goal < 100 12/18/2012  . Hypogonadism male 12/18/2012  . Preventive measure 11/03/2012  .  Low back pain with spinal stenosis 06/02/2012    Roderic Scarce PT 08/31/2015, 8:34 AM  [facility] [address] [place], [place], [address] Phone: [phone]   Fax:  [phone]

## 2015-08-31 NOTE — Patient Instructions (Signed)
Achilles / Gastroc, Standing   Stand, right foot behind, heel on floor and turned slightly out, leg straight, forward leg bent. Move hips forward. Hold _30-45__ seconds. Repeat _2__ times per session. Do 1-2___ sessions per day.  Copyright  VHI. All rights reserved.   Massage the calf or use a ball for pressure relief.  Karis Juba [phone]

## 2015-09-07 ENCOUNTER — Ambulatory Visit (INDEPENDENT_AMBULATORY_CARE_PROVIDER_SITE_OTHER): Payer: BLUE CROSS/BLUE SHIELD | Admitting: Physical Therapy

## 2015-09-07 ENCOUNTER — Encounter: Payer: Self-pay | Admitting: Physical Therapy

## 2015-09-07 ENCOUNTER — Encounter: Payer: BLUE CROSS/BLUE SHIELD | Admitting: Physical Therapy

## 2015-09-07 DIAGNOSIS — M25671 Stiffness of right ankle, not elsewhere classified: Secondary | ICD-10-CM

## 2015-09-07 DIAGNOSIS — M25561 Pain in right knee: Secondary | ICD-10-CM

## 2015-09-07 NOTE — Patient Instructions (Signed)
Heel raises x 10 with toes pointed in/out and straight.

## 2015-09-07 NOTE — Therapy (Signed)
[place], [place], [address] Phone: [phone]   Fax:  [phone]  Physical Therapy Treatment  Patient Details  Name: Bobby Saunders MRN: [id number] Date of Birth: 01-31-1966 Referring Provider:  Silverio Decamp,*  Encounter Date: 09/07/2015      PT End of Session - 09/07/15 1055    Visit Number 2   PT Start Time 5643   PT Stop Time 1054   PT Time Calculation (min) 32 min   Activity Tolerance Patient tolerated treatment well      Past Medical History  Diagnosis Date  . Sleep apnea     cpap 6 yrs    Past Surgical History  Procedure Laterality Date  . Elbow surgery Bilateral 01 last    numerous lft , one right  . Lumbar laminectomy/decompression microdiscectomy N/A 05/13/2014    Procedure: LUMBAR LAMINECTOMY/DECOMPRESSION MICRODISCECTOMY;  Surgeon: Sinclair Ship, MD;  Location: [facility];  Service: Orthopedics;  Laterality: N/A;  Lumbar 3-4 decompression    There were no vitals filed for this visit.  Visit Diagnosis:  Stiffness of ankle joint, right  Pain in joint, lower leg, right      Subjective Assessment - 09/07/15 1029    Subjective Pt reports he hasn't had any problem since the last visit and he hasn't been doing his exercise either.  Thinks that the ultrasound and cross friction massage from last treatment helped.                          [facility] Adult PT Treatment/Exercise - 09/07/15 0001    Ultrasound   Ultrasound Location Rt gastroc   Ultrasound Parameters 100%, 1.5 w/cm2, 1.69mz   Ultrasound Goals --  tightness   Manual Therapy   Manual Therapy Soft tissue mobilization   Soft tissue mobilization to Rt calf, cross friction massage. TPR   Ankle Exercises: Aerobic   Stationary Bike L3x7'   Ankle Exercises: Standing   Heel Raises 10 reps  each, toes in/out/ straight                PT Education - 09/07/15 1037    Education  provided Yes   Education Details heel raises   Person(s) Educated Patient   Methods Explanation;Demonstration   Comprehension Verbalized understanding;Returned demonstration             PT Long Term Goals - 09/07/15 1040    PT LONG TERM GOAL #1   Title I with HEP ( 09/28/15)    Status Achieved   PT LONG TERM GOAL #2   Title increase Rt ankle dosiflexion =/> 12 degrees ( 09/18/15)    Status Achieved  13 degrees   PT LONG TERM GOAL #3   Title report pain decrease =/> 75% with squating. ( 09/28/15)    Status Achieved   PT LONG TERM GOAL #4   Title improve FOTO =/< 21% limited ( 09/28/15)    Status Achieved  1% limited               Plan - 09/07/15 1055    Clinical Impression Statement Pt had excellent response to first visit.  All  symptoms have resolved and he has returned to all activities without problems.    PT Next Visit Plan D/C to HEP   Consulted and Agree with Plan of Care Patient        Problem List Patient Active Problem List  Diagnosis Date Noted  . Strain of calf muscle 08/23/2015  . Right shoulder pain 03/21/2015  . Difficulty staying awake 07/09/2014  . Obstructive apnea 07/09/2014  . Skin tag 01/29/2013  . Acne 01/29/2013  . Hyperlipidemia LDL goal < 100 12/18/2012  . Hypogonadism male 12/18/2012  . Preventive measure 11/03/2012  . Low back pain with spinal stenosis 06/02/2012    Jeral Pinch PT  09/07/2015, 10:58 AM  [facility] [place], [place], [address] Phone: [phone]   Fax:  [phone]  PHYSICAL THERAPY DISCHARGE SUMMARY  Visits from Start of Care: 2  Current functional level related to goals / functional outcomes: Able to perform all activities   Remaining deficits: none   Education / Equipment: HEP Plan: Patient agrees to discharge.  Patient goals were met. Patient is being discharged due to meeting the stated rehab goals.  ?????        Jeral Pinch, PT 09/07/2015 10:59 AM

## 2016-02-02 ENCOUNTER — Ambulatory Visit (INDEPENDENT_AMBULATORY_CARE_PROVIDER_SITE_OTHER): Payer: BLUE CROSS/BLUE SHIELD

## 2016-02-02 ENCOUNTER — Ambulatory Visit (INDEPENDENT_AMBULATORY_CARE_PROVIDER_SITE_OTHER): Payer: BLUE CROSS/BLUE SHIELD | Admitting: Sports Medicine

## 2016-02-02 VITALS — BP 136/80 | HR 64 | Ht 70.0 in | Wt 284.0 lb

## 2016-02-02 DIAGNOSIS — M19022 Primary osteoarthritis, left elbow: Secondary | ICD-10-CM | POA: Diagnosis not present

## 2016-02-02 NOTE — Assessment & Plan Note (Signed)
Elbow joint injection as above, there is significant osteoarthritis with visible intra-articular loose bodies. Return to see me one month after injection to evaluate response.

## 2016-02-02 NOTE — Progress Notes (Signed)
  Subjective:    CC: Left elbow pain  HPI: Several years now this pleasant 50 year old male has had severe left elbow pain, he has had trauma with a radial head excision in the past. Now has occasional locking, achiness, pain. Symptoms are severe, persistent.  Past medical history, Surgical history, Family history not pertinant except as noted below, Social history, Allergies, and medications have been entered into the medical record, reviewed, and no changes needed.   Review of Systems: No fevers, chills, night sweats, weight loss, chest pain, or shortness of breath.   Objective:    General: Well Developed, well nourished, and in no acute distress.  Neuro: Alert and oriented x3, extra-ocular muscles intact, sensation grossly intact.  HEENT: Normocephalic, atraumatic, pupils equal round reactive to light, neck supple, no masses, no lymphadenopathy, thyroid nonpalpable.  Skin: Warm and dry, no rashes. Cardiac: Regular rate and rhythm, no murmurs rubs or gallops, no lower extremity edema.  Respiratory: Clear to auscultation bilaterally. Not using accessory muscles, speaking in full sentences. Left Elbow: Unremarkable to inspection. Range of motion lacks about 5 of extension Strength is full to all of the above directions Stable to varus, valgus stress. Negative moving valgus stress test. Tender to palpation laterally Ulnar nerve does not sublux. Negative cubital tunnel Tinel's.  Procedure: Real-time Ultrasound Guided Injection of left elbow joint Device: GE Logiq E  Verbal informed consent obtained.  Time-out conducted.  Noted no overlying erythema, induration, or other signs of local infection.  Skin prepped in a sterile fashion.  Local anesthesia: Topical Ethyl chloride.  With sterile technique and under real time ultrasound guidance:  Using a 25-gauge needle pierced the supinator muscle and entered the elbow joint, 1 mL kenalog 40, 1 mL lidocaine, 1 mL Marcaine injected  easily. Completed without difficulty  Pain immediately resolved suggesting accurate placement of the medication.  Advised to call if fevers/chills, erythema, induration, drainage, or persistent bleeding.  Images permanently stored and available for review in the ultrasound unit.  Impression: Technically successful ultrasound guided injection.  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  Impression and Recommendations:

## 2016-03-01 ENCOUNTER — Ambulatory Visit: Payer: BLUE CROSS/BLUE SHIELD | Admitting: Sports Medicine

## 2016-05-11 ENCOUNTER — Encounter: Payer: Self-pay | Admitting: Sports Medicine

## 2016-05-11 ENCOUNTER — Ambulatory Visit (INDEPENDENT_AMBULATORY_CARE_PROVIDER_SITE_OTHER): Payer: BLUE CROSS/BLUE SHIELD | Admitting: Sports Medicine

## 2016-05-11 VITALS — BP 133/81 | HR 65 | Resp 18 | Wt 289.5 lb

## 2016-05-11 DIAGNOSIS — L723 Sebaceous cyst: Secondary | ICD-10-CM | POA: Insufficient documentation

## 2016-05-11 DIAGNOSIS — E785 Hyperlipidemia, unspecified: Secondary | ICD-10-CM

## 2016-05-11 LAB — CBC
HCT: 48.1 % (ref 38.5–50.0)
Hemoglobin: 16.5 g/dL (ref 13.2–17.1)
MCH: 31.8 pg (ref 27.0–33.0)
MCHC: 34.3 g/dL (ref 32.0–36.0)
MCV: 92.7 fL (ref 80.0–100.0)
MPV: 11.4 fL (ref 7.5–12.5)
Platelets: 209 K/uL (ref 140–400)
RBC: 5.19 MIL/uL (ref 4.20–5.80)
RDW: 13.9 % (ref 11.0–15.0)
WBC: 7.3 10*3/uL (ref 3.8–10.8)

## 2016-05-11 LAB — HEMOGLOBIN A1C
Hgb A1c MFr Bld: 6.4 % — ABNORMAL HIGH (ref <5.7)
Mean Plasma Glucose: 137 mg/dL

## 2016-05-11 LAB — COMPREHENSIVE METABOLIC PANEL WITH GFR
AST: 25 U/L (ref 10–40)
BUN: 14 mg/dL (ref 7–25)
CO2: 25 mmol/L (ref 20–31)
Chloride: 105 mmol/L (ref 98–110)
Creat: 1.09 mg/dL (ref 0.60–1.35)
Glucose, Bld: 106 mg/dL — ABNORMAL HIGH (ref 65–99)
Sodium: 142 mmol/L (ref 135–146)
Total Bilirubin: 0.6 mg/dL (ref 0.2–1.2)

## 2016-05-11 LAB — COMPREHENSIVE METABOLIC PANEL
ALT: 23 U/L (ref 9–46)
Albumin: 4 g/dL (ref 3.6–5.1)
Alkaline Phosphatase: 42 U/L (ref 40–115)
Calcium: 9.3 mg/dL (ref 8.6–10.3)
Potassium: 4.4 mmol/L (ref 3.5–5.3)
Total Protein: 6.5 g/dL (ref 6.1–8.1)

## 2016-05-11 LAB — TSH: TSH: 1.1 m[IU]/L (ref 0.40–4.50)

## 2016-05-11 LAB — LIPID PANEL
Cholesterol: 239 mg/dL — ABNORMAL HIGH (ref 125–200)
HDL: 45 mg/dL (ref >=40)
LDL Cholesterol: 177 mg/dL — ABNORMAL HIGH (ref <130)
Total CHOL/HDL Ratio: 5.3 Ratio — ABNORMAL HIGH (ref <=5.0)
Triglycerides: 86 mg/dL (ref <150)
VLDL: 17 mg/dL (ref <30)

## 2016-05-11 NOTE — Progress Notes (Signed)
  Subjective:    CC:  Left thigh cyst  HPI: This is a pleasant 50 year old male who comes in with a cyst on his left thigh for some time now.  Pain is moderate, persistent, and it drains recurrently. He would like this excised. Symptoms do not radiate. No constitutional symptoms.  Past medical history, Surgical history, Family history not pertinant except as noted below, Social history, Allergies, and medications have been entered into the medical record, reviewed, and no changes needed.   Review of Systems: No fevers, chills, night sweats, weight loss, chest pain, or shortness of breath.   Objective:    General: Well Developed, well nourished, and in no acute distress.  Neuro: Alert and oriented x3, extra-ocular muscles intact, sensation grossly intact.  HEENT: Normocephalic, atraumatic, pupils equal round reactive to light, neck supple, no masses, no lymphadenopathy, thyroid nonpalpable.  Skin: Warm and dry, no rashes. Cardiac: Regular rate and rhythm, no murmurs rubs or gallops, no lower extremity edema.  Respiratory: Clear to auscultation bilaterally. Not using accessory muscles, speaking in full sentences. Left thigh: Small draining sebaceous cyst on the inner thigh.  Procedure:  Excision of 1.1 cm left thigh sebaceous cyst Risks, benefits, and alternatives explained and consent obtained. Time out conducted. Surface prepped with alcohol. 5cc lidocaine with epinephine infiltrated in a field block. Adequate anesthesia ensured. Area prepped and draped in a sterile fashion. Excision performed with: Using a #15 blade and made an elliptical incision excising the sebaceous cyst in block, I then undermined the edges of the wound to decrease tension and placed a single 3-0 horizontal mattress Ethilon suture. Hemostasis achieved. Pt stable.  Impression and Recommendations:

## 2016-05-11 NOTE — Assessment & Plan Note (Signed)
Excisional biopsy, sutures placed, return 1 week for suture removal

## 2016-05-15 ENCOUNTER — Other Ambulatory Visit: Payer: Self-pay

## 2016-05-15 MED ORDER — ATORVASTATIN CALCIUM 40 MG PO TABS: 40.0000 mg | ORAL_TABLET | Freq: Every day | ORAL | Status: DC

## 2016-05-17 ENCOUNTER — Ambulatory Visit (INDEPENDENT_AMBULATORY_CARE_PROVIDER_SITE_OTHER): Payer: BLUE CROSS/BLUE SHIELD | Admitting: Sports Medicine

## 2016-05-17 ENCOUNTER — Encounter: Payer: Self-pay | Admitting: Sports Medicine

## 2016-05-17 VITALS — BP 118/75 | HR 64 | Ht 70.0 in | Wt 292.0 lb

## 2016-05-17 DIAGNOSIS — L723 Sebaceous cyst: Secondary | ICD-10-CM

## 2016-05-17 NOTE — Progress Notes (Signed)
  Subjective: 1 week post excision of the sebaceous cyst, doing well   Objective: General: Well-developed, well-nourished, and in no acute distress. Left thigh: Incision is clean, dry, intact. Sutures removed, I put a bit of Dermabond to reinforce the wound.  Assessment/plan:

## 2016-05-17 NOTE — Assessment & Plan Note (Signed)
Doing well return as needed.

## 2016-07-17 ENCOUNTER — Encounter: Payer: Self-pay | Admitting: Sports Medicine

## 2016-07-17 ENCOUNTER — Ambulatory Visit (INDEPENDENT_AMBULATORY_CARE_PROVIDER_SITE_OTHER): Payer: BLUE CROSS/BLUE SHIELD | Admitting: Sports Medicine

## 2016-07-17 DIAGNOSIS — Z299 Encounter for prophylactic measures, unspecified: Secondary | ICD-10-CM

## 2016-07-17 NOTE — Progress Notes (Signed)

## 2016-07-17 NOTE — Assessment & Plan Note (Signed)
Custom orthotics as above, return as needed. 

## 2016-11-29 ENCOUNTER — Telehealth: Payer: Self-pay | Admitting: *Deleted

## 2016-11-29 MED ORDER — DIPHENOXYLATE-ATROPINE 2.5-0.025 MG PO TABS: ORAL_TABLET | ORAL | 3 refills | Status: DC

## 2016-11-29 NOTE — Telephone Encounter (Signed)
Pt's wife notified of rx. 

## 2016-11-29 NOTE — Telephone Encounter (Signed)
Pt's wife called this morning stating that the pt was exposed to the norovirus & has watery diarrhea since Sunday morning.  She states that he is literally having 25-30 "dark watery" bowel movements a day.  They want to know if you can send him something in to stop the diarrhea.  Please advise.

## 2016-11-29 NOTE — Telephone Encounter (Signed)
Rx for Lomotil is in my box, it has to be faxed.

## 2016-11-30 ENCOUNTER — Encounter: Payer: Self-pay | Admitting: Sports Medicine

## 2016-11-30 ENCOUNTER — Ambulatory Visit (INDEPENDENT_AMBULATORY_CARE_PROVIDER_SITE_OTHER): Payer: BLUE CROSS/BLUE SHIELD | Admitting: Sports Medicine

## 2016-11-30 DIAGNOSIS — A09 Infectious gastroenteritis and colitis, unspecified: Secondary | ICD-10-CM | POA: Diagnosis not present

## 2016-11-30 DIAGNOSIS — R197 Diarrhea, unspecified: Secondary | ICD-10-CM

## 2016-11-30 DIAGNOSIS — K529 Noninfective gastroenteritis and colitis, unspecified: Secondary | ICD-10-CM | POA: Insufficient documentation

## 2016-11-30 MED ORDER — HYOSCYAMINE SULFATE 0.125 MG PO TABS: 0.2500 mg | ORAL_TABLET | Freq: Four times a day (QID) | ORAL | 0 refills | Status: DC | PRN

## 2016-11-30 MED ORDER — ONDANSETRON 8 MG PO TBDP: 8.0000 mg | ORAL_TABLET | Freq: Three times a day (TID) | ORAL | 3 refills | Status: DC | PRN

## 2016-11-30 NOTE — Assessment & Plan Note (Signed)
Present now for several days. No response to Imodium or Lomotil. Checking stool cultures, ova and parasites, fecal lactoferrin. Adding high-dose Levsin. He did report a few episodes of black and tarry stools, ultimately when this resolves we are going to need him worked up for GI bleed.

## 2016-11-30 NOTE — Progress Notes (Signed)
  Subjective:    CC: Diarrhea  HPI: This is a pleasant 50 year old male, for 4 days or so after eating some smoked meat he developed watery diarrhea, but started with a bit of black stools, which subsequently resolved, he has over 20 episodes of watery diarrhea per day, he has been able to keep up his oral intake of fluids. No vomiting, only minimal nausea. Really denies any belly pain, no hematochezia or any further melena. There is no epigastric pain, no constitutional symptoms.  Past medical history:  Negative.  See flowsheet/record as well for more information.  Surgical history: Negative.  See flowsheet/record as well for more information.  Family history: Negative.  See flowsheet/record as well for more information.  Social history: Negative.  See flowsheet/record as well for more information.  Allergies, and medications have been entered into the medical record, reviewed, and no changes needed.   Review of Systems: No fevers, chills, night sweats, weight loss, chest pain, or shortness of breath.   Objective:    General: Well Developed, well nourished, and in no acute distress.  Neuro: Alert and oriented x3, extra-ocular muscles intact, sensation grossly intact.  HEENT: Normocephalic, atraumatic, pupils equal round reactive to light, neck supple, no masses, no lymphadenopathy, thyroid nonpalpable.  Skin: Warm and dry, no rashes. Cardiac: Regular rate and rhythm, no murmurs rubs or gallops, no lower extremity edema.  Respiratory: Clear to auscultation bilaterally. Not using accessory muscles, speaking in full sentences. Abdomen: Soft, nontender, nondistended, no bowel sounds, palpable masses, guarding, rigidity, rebound tenderness.  Impression and Recommendations:    Diarrhea Present now for several days. No response to Imodium or Lomotil. Checking stool cultures, ova and parasites, fecal lactoferrin. Adding high-dose Levsin. He did report a few episodes of black and tarry  stools, ultimately when this resolves we are going to need him worked up for GI bleed.

## 2016-12-05 ENCOUNTER — Ambulatory Visit: Payer: BLUE CROSS/BLUE SHIELD | Admitting: Sports Medicine

## 2016-12-05 LAB — C. DIFFICILE GDH AND TOXIN A/B
C. difficile GDH: NOT DETECTED
C. difficile Toxin A/B: NOT DETECTED

## 2016-12-05 LAB — FECAL LACTOFERRIN, QUANT: Lactoferrin: POSITIVE

## 2016-12-05 LAB — OVA AND PARASITE EXAMINATION: OP: NONE SEEN

## 2016-12-08 LAB — STOOL CULTURE

## 2017-01-01 ENCOUNTER — Telehealth: Payer: Self-pay | Admitting: Sports Medicine

## 2017-01-01 NOTE — Telephone Encounter (Signed)
Pt's wife called to see if short term disability paperwork has been completed and sent in, will route for review.

## 2017-01-01 NOTE — Telephone Encounter (Signed)
Any paperwork that has been dropped off has been completed.

## 2017-01-01 NOTE — Telephone Encounter (Signed)
Spoke with wife, paperwork is to be faxed today.

## 2017-03-21 ENCOUNTER — Ambulatory Visit (INDEPENDENT_AMBULATORY_CARE_PROVIDER_SITE_OTHER): Payer: BLUE CROSS/BLUE SHIELD

## 2017-03-21 ENCOUNTER — Encounter: Payer: Self-pay | Admitting: Sports Medicine

## 2017-03-21 ENCOUNTER — Ambulatory Visit (INDEPENDENT_AMBULATORY_CARE_PROVIDER_SITE_OTHER): Payer: BLUE CROSS/BLUE SHIELD | Admitting: Sports Medicine

## 2017-03-21 DIAGNOSIS — M19022 Primary osteoarthritis, left elbow: Secondary | ICD-10-CM

## 2017-03-21 DIAGNOSIS — M24022 Loose body in left elbow: Secondary | ICD-10-CM

## 2017-03-21 MED ORDER — HYDROCODONE-ACETAMINOPHEN 5-325 MG PO TABS: 1.0000 | ORAL_TABLET | Freq: Three times a day (TID) | ORAL | 0 refills | Status: DC | PRN

## 2017-03-21 NOTE — Progress Notes (Signed)
  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 Pain seems be localized deep within the joint itself. Persistent.  Past medical history:  Negative.  See flowsheet/record as well for more information.  Surgical history: Negative.  See flowsheet/record as well for more information.  Family history: Negative.  See flowsheet/record as well for more information.  Social history: Negative.  See flowsheet/record as well for more information.  Allergies, and medications have been entered into the medical record, reviewed, and no changes needed.   Review of Systems: No fevers, chills, night sweats, weight loss, chest pain, or shortness of breath.   Objective:    General: Well Developed, well nourished, and in no acute distress.  Neuro: Alert and oriented x3, extra-ocular muscles intact, sensation grossly intact.  HEENT: Normocephalic, atraumatic, pupils equal round reactive to light, neck supple, no masses, no lymphadenopathy, thyroid nonpalpable.  Skin: Warm and dry, no rashes. Cardiac: Regular rate and rhythm, no murmurs rubs or gallops, no lower extremity edema.  Respiratory: Clear to auscultation bilaterally. Not using accessory muscles, speaking in full sentences. Left elbow: Held flexed against abdomen, tender to palpation laterally, full range of motion but painful and slow. Ligament destruction as are intact, no tenderness over the olecranon.  Impression and Recommendations:    Primary osteoarthritis of left elbow Recent fall at the left elbow with increasing pain, question new fracture. He does have existing elbow osteoarthritis with multiple intra-articular loose bodies. Sling, Vicodin, if x-rays don't show any fractures and we may consider an injection at the follow-up visit in one week.  I  spent 25 minutes with this patient, greater than 50% was face-to-face time counseling regarding the above diagnoses

## 2017-03-21 NOTE — Assessment & Plan Note (Signed)
Recent fall at the left elbow with increasing pain, question new fracture. He does have existing elbow osteoarthritis with multiple intra-articular loose bodies. Sling, Vicodin, if x-rays don't show any fractures and we may consider an injection at the follow-up visit in one week.

## 2017-03-22 ENCOUNTER — Ambulatory Visit (INDEPENDENT_AMBULATORY_CARE_PROVIDER_SITE_OTHER): Payer: BLUE CROSS/BLUE SHIELD | Admitting: Sports Medicine

## 2017-03-22 DIAGNOSIS — M19022 Primary osteoarthritis, left elbow: Secondary | ICD-10-CM | POA: Diagnosis not present

## 2017-03-22 NOTE — Assessment & Plan Note (Signed)
Elbow joint injection as above. Strapped with compressive dressing.

## 2017-03-22 NOTE — Progress Notes (Signed)
  Subjective:    CC: Followup  HPI: This is a pleasant 51 year old male, he fell directly on his elbow, it was painful and swollen, initially fracture was suspected (he is post radial head excision) but xrays were negative.  He does have extensive OA in the elbow so injection was offered.  He returns today with persistent pain and swelling. Severe, persistent.  Past medical history:  Negative.  See flowsheet/record as well for more information.  Surgical history: Negative.  See flowsheet/record as well for more information.  Family history: Negative.  See flowsheet/record as well for more information.  Social history: Negative.  See flowsheet/record as well for more information.  Allergies, and medications have been entered into the medical record, reviewed, and no changes needed.   Review of Systems: No fevers, chills, night sweats, weight loss, chest pain, or shortness of breath.   Objective:    General: Well Developed, well nourished, and in no acute distress.  Neuro: Alert and oriented x3, extra-ocular muscles intact, sensation grossly intact.  HEENT: Normocephalic, atraumatic, pupils equal round reactive to light, neck supple, no masses, no lymphadenopathy, thyroid nonpalpable.  Skin: Warm and dry, no rashes. Cardiac: Regular rate and rhythm, no murmurs rubs or gallops, no lower extremity edema.  Respiratory: Clear to auscultation bilaterally. Not using accessory muscles, speaking in full sentences.  Procedure: Real-time Ultrasound Guided Injection of left elbow joint Device: GE Logiq E  Ultrasound guided injection is preferred based studies that show increased duration, increased effect, greater accuracy, decreased procedural pain, increased response rate, and decreased cost with ultrasound guided versus blind injection.  Verbal informed consent obtained.  Time-out conducted.  Noted no overlying erythema, induration, or other signs of local infection.  Skin prepped in a sterile  fashion.  Local anesthesia: Topical Ethyl chloride.  With sterile technique and under real time ultrasound guidance:  Advanced into the joint and injected 1cc kenalog 40, 1 cc lidocaine, and 1cc bupivicaine. Completed without difficulty  Pain immediately resolved suggesting accurate placement of the medication.  Advised to call if fevers/chills, erythema, induration, drainage, or persistent bleeding.  Images permanently stored and available for review in the ultrasound unit.  Impression: Technically successful ultrasound guided injection.   Impression and Recommendations:    Primary osteoarthritis of left elbow Elbow joint injection as above. Strapped with compressive dressing.

## 2017-03-25 ENCOUNTER — Telehealth: Payer: Self-pay

## 2017-03-25 ENCOUNTER — Encounter: Payer: Self-pay | Admitting: Sports Medicine

## 2017-03-25 NOTE — Telephone Encounter (Signed)
Letter in box. 

## 2017-03-25 NOTE — Telephone Encounter (Signed)
Pt is in need of an extension to his work note. Please assist.

## 2017-03-26 NOTE — Telephone Encounter (Signed)
Left VM

## 2017-04-10 ENCOUNTER — Encounter: Payer: Self-pay | Admitting: Sports Medicine

## 2017-04-10 ENCOUNTER — Ambulatory Visit (INDEPENDENT_AMBULATORY_CARE_PROVIDER_SITE_OTHER): Payer: BLUE CROSS/BLUE SHIELD | Admitting: Sports Medicine

## 2017-04-10 DIAGNOSIS — M19022 Primary osteoarthritis, left elbow: Secondary | ICD-10-CM

## 2017-04-10 DIAGNOSIS — M25511 Pain in right shoulder: Secondary | ICD-10-CM

## 2017-04-10 DIAGNOSIS — G8929 Other chronic pain: Secondary | ICD-10-CM

## 2017-04-10 NOTE — Assessment & Plan Note (Signed)
Post injection, no improvement, he recently had an arthroscopy with Dr. Ave Filterhandler, currently in the recovery phase.

## 2017-04-10 NOTE — Progress Notes (Signed)
  Subjective:    CC: Right shoulder pain  HPI: Left elbow osteoarthritis: Injection did not help, he went to Dr. Ave Filterhandler, arthroscopy was performed with removal of several intra-articular loose bodies, overall painful still.  Right shoulder pain: Localized over the deltoid, worse with overhead activities, moderate, persistent without radiation. No mechanical symptoms, no trauma. Desires interventional treatment today.  Past medical history:  Negative.  See flowsheet/record as well for more information.  Surgical history: Negative.  See flowsheet/record as well for more information.  Family history: Negative.  See flowsheet/record as well for more information.  Social history: Negative.  See flowsheet/record as well for more information.  Allergies, and medications have been entered into the medical record, reviewed, and no changes needed.   Review of Systems: No fevers, chills, night sweats, weight loss, chest pain, or shortness of breath.   Objective:    General: Well Developed, well nourished, and in no acute distress.  Neuro: Alert and oriented x3, extra-ocular muscles intact, sensation grossly intact.  HEENT: Normocephalic, atraumatic, pupils equal round reactive to light, neck supple, no masses, no lymphadenopathy, thyroid nonpalpable.  Skin: Warm and dry, no rashes. Cardiac: Regular rate and rhythm, no murmurs rubs or gallops, no lower extremity edema.  Respiratory: Clear to auscultation bilaterally. Not using accessory muscles, speaking in full sentences. Right Shoulder: Inspection reveals no abnormalities, atrophy or asymmetry. Palpation is normal with no tenderness over AC joint or bicipital groove. ROM is full in all planes. Rotator cuff strength normal throughout. Positive Neer and Hawkin's tests, empty can. Speeds and Yergason's tests normal. No labral pathology noted with negative Obrien's, negative crank, negative clunk, and good stability. Normal scapular function  observed. No painful arc and no drop arm sign. No apprehension sign  Procedure: Real-time Ultrasound Guided Injection of right subacromial bursa Device: GE Logiq E  Verbal informed consent obtained.  Time-out conducted.  Noted no overlying erythema, induration, or other signs of local infection.  Skin prepped in a sterile fashion.  Local anesthesia: Topical Ethyl chloride.  With sterile technique and under real time ultrasound guidance:  1 mL every 40, 1 mL lidocaine, 1 mL bupivacaine injected easily Completed without difficulty  Pain immediately resolved suggesting accurate placement of the medication.  Advised to call if fevers/chills, erythema, induration, drainage, or persistent bleeding.  Images permanently stored and available for review in the ultrasound unit.  Impression: Technically successful ultrasound guided injection.  Impression and Recommendations:    Right shoulder pain Repeat subacromial injection. Return as needed.  Primary osteoarthritis of left elbow Post injection, no improvement, he recently had an arthroscopy with Dr. Ave Filterhandler, currently in the recovery phase.

## 2017-04-10 NOTE — Assessment & Plan Note (Signed)
Repeat subacromial injection. Return as needed.

## 2017-04-22 ENCOUNTER — Ambulatory Visit: Payer: BLUE CROSS/BLUE SHIELD | Admitting: Sports Medicine

## 2017-09-13 ENCOUNTER — Ambulatory Visit (INDEPENDENT_AMBULATORY_CARE_PROVIDER_SITE_OTHER): Payer: BLUE CROSS/BLUE SHIELD | Admitting: Sports Medicine

## 2017-09-13 ENCOUNTER — Ambulatory Visit (INDEPENDENT_AMBULATORY_CARE_PROVIDER_SITE_OTHER): Payer: BLUE CROSS/BLUE SHIELD

## 2017-09-13 DIAGNOSIS — E119 Type 2 diabetes mellitus without complications: Secondary | ICD-10-CM

## 2017-09-13 DIAGNOSIS — S8992XA Unspecified injury of left lower leg, initial encounter: Secondary | ICD-10-CM

## 2017-09-13 DIAGNOSIS — W19XXXA Unspecified fall, initial encounter: Secondary | ICD-10-CM | POA: Diagnosis not present

## 2017-09-13 DIAGNOSIS — Z0000 Encounter for general adult medical examination without abnormal findings: Secondary | ICD-10-CM

## 2017-09-13 DIAGNOSIS — Z23 Encounter for immunization: Secondary | ICD-10-CM

## 2017-09-13 DIAGNOSIS — E785 Hyperlipidemia, unspecified: Secondary | ICD-10-CM

## 2017-09-13 NOTE — Progress Notes (Addendum)
  Subjective:    CC: Follow-up  HPI: This is a pleasant 51 year old male, unfortunately he fell in the shower 2 weeks ago, he got a little bit bruised up his main issue is his left knee which was hyperflexed. He did get some initial swelling, now has pain at the anteromedial joint line, worse with weightbearing. Moderate, persistent, no mechanical symptoms.  Hyperlipidemia: Due to recheck.  Preventive measures: Due for flu shot, up-to-date on colon cancer screening  Past medical history:  Negative.  See flowsheet/record as well for more information.  Surgical history: Negative.  See flowsheet/record as well for more information.  Family history: Negative.  See flowsheet/record as well for more information.  Social history: Negative.  See flowsheet/record as well for more information.  Allergies, and medications have been entered into the medical record, reviewed, and no changes needed.   Review of Systems: No fevers, chills, night sweats, weight loss, chest pain, or shortness of breath.   Objective:    General: Well Developed, well nourished, and in no acute distress.  Neuro: Alert and oriented x3, extra-ocular muscles intact, sensation grossly intact.  HEENT: Normocephalic, atraumatic, pupils equal round reactive to light, neck supple, no masses, no lymphadenopathy, thyroid nonpalpable.  Skin: Warm and dry, no rashes. Cardiac: Regular rate and rhythm, no murmurs rubs or gallops, no lower extremity edema.  Respiratory: Clear to auscultation bilaterally. Not using accessory muscles, speaking in full sentences. Left Knee: Minimally swollen with tenderness medial joint line ROM normal in flexion and extension and lower leg rotation. Ligaments with solid consistent endpoints including ACL, PCL, LCL, MCL. Negative Mcmurray's and provocative meniscal tests. Non painful patellar compression. Patellar and quadriceps tendons unremarkable. Hamstring and quadriceps strength is  normal.  Procedure: Real-time Ultrasound Guided Injection of left knee Device: GE Logiq E  Verbal informed consent obtained.  Time-out conducted.  Noted no overlying erythema, induration, or other signs of local infection.  Skin prepped in a sterile fashion.  Local anesthesia: Topical Ethyl chloride.  With sterile technique and under real time ultrasound guidance:  1 mL Kenalog 40, 2 mL lidocaine, 2 mL bupivacaine injected easily Completed without difficulty  Pain immediately resolved suggesting accurate placement of the medication.  Advised to call if fevers/chills, erythema, induration, drainage, or persistent bleeding.  Images permanently stored and available for review in the ultrasound unit.  Impression: Technically successful ultrasound guided injection.  Impression and Recommendations:    Annual physical exam Flu shot, routine blood work, colon cancer screening.  Hyperlipidemia with target LDL less than 100 Rechecking lipids  Left knee injury Worsening over 2 weeks after a fall, suspect reinjury of a degenerative meniscal tear. Injection as above. X-rays. Return in a month. Rehabilitation exercises given.  Controlled type 2 diabetes mellitus without complication, without long-term current use of insulin (HCC) Will catch him up on preventive measures at next visit. Adding metformin 1000 BID.  ___________________________________________ [name]. Benjamin Stain, M.D., ABFM., CAQSM. Primary Care and Sports Medicine [facility]  Adjunct Instructor of Family Medicine  [employer]

## 2017-09-13 NOTE — Assessment & Plan Note (Signed)
Worsening over 2 weeks after a fall, suspect reinjury of a degenerative meniscal tear. Injection as above. X-rays. Return in a month. Rehabilitation exercises given.

## 2017-09-13 NOTE — Assessment & Plan Note (Signed)
Flu shot, routine blood work, colon cancer screening.

## 2017-09-13 NOTE — Assessment & Plan Note (Signed)
Rechecking lipids. 

## 2017-09-14 LAB — COMPREHENSIVE METABOLIC PANEL WITH GFR
AG Ratio: 2 (calc) (ref 1.0–2.5)
AST: 17 U/L (ref 10–35)
Alkaline phosphatase (APISO): 51 U/L (ref 40–115)
CO2: 27 mmol/L (ref 20–32)
Chloride: 107 mmol/L (ref 98–110)
Globulin: 2.1 g/dL (ref 1.9–3.7)
Total Bilirubin: 0.7 mg/dL (ref 0.2–1.2)

## 2017-09-14 LAB — CBC
HCT: 47.4 % (ref 38.5–50.0)
Hemoglobin: 16.4 g/dL (ref 13.2–17.1)
MCH: 31.3 pg (ref 27.0–33.0)
MCHC: 34.6 g/dL (ref 32.0–36.0)
MCV: 90.5 fL (ref 80.0–100.0)
MPV: 11.8 fL (ref 7.5–12.5)
Platelets: 197 Thousand/uL (ref 140–400)
RBC: 5.24 Million/uL (ref 4.20–5.80)
RDW: 12.7 % (ref 11.0–15.0)
WBC: 8.8 Thousand/uL (ref 3.8–10.8)

## 2017-09-14 LAB — COMPREHENSIVE METABOLIC PANEL
ALT: 20 U/L (ref 9–46)
Albumin: 4.1 g/dL (ref 3.6–5.1)
BUN: 14 mg/dL (ref 7–25)
Calcium: 8.8 mg/dL (ref 8.6–10.3)
Creat: 1.05 mg/dL (ref 0.70–1.33)
Glucose, Bld: 172 mg/dL — ABNORMAL HIGH (ref 65–99)
Potassium: 4.2 mmol/L (ref 3.5–5.3)
Sodium: 140 mmol/L (ref 135–146)
Total Protein: 6.2 g/dL (ref 6.1–8.1)

## 2017-09-14 LAB — VITAMIN D 25 HYDROXY (VIT D DEFICIENCY, FRACTURES): Vit D, 25-Hydroxy: 21 ng/mL — ABNORMAL LOW (ref 30–100)

## 2017-09-14 LAB — HIV ANTIBODY (ROUTINE TESTING W REFLEX): HIV 1&2 Ab, 4th Generation: NONREACTIVE

## 2017-09-14 LAB — LIPID PANEL W/REFLEX DIRECT LDL
Cholesterol: 188 mg/dL (ref <200)
HDL: 41 mg/dL (ref >40)
LDL Cholesterol (Calc): 126 mg/dL — ABNORMAL HIGH
Non-HDL Cholesterol (Calc): 147 mg/dL (calc) — ABNORMAL HIGH (ref <130)
Total CHOL/HDL Ratio: 4.6 (calc) (ref <5.0)
Triglycerides: 107 mg/dL (ref <150)

## 2017-09-14 LAB — HEMOGLOBIN A1C
Hgb A1c MFr Bld: 7 % of total Hgb — ABNORMAL HIGH (ref <5.7)
Mean Plasma Glucose: 154 (calc)
eAG (mmol/L): 8.5 (calc)

## 2017-09-15 DIAGNOSIS — E119 Type 2 diabetes mellitus without complications: Secondary | ICD-10-CM | POA: Insufficient documentation

## 2017-09-15 MED ORDER — VITAMIN D (ERGOCALCIFEROL) 1.25 MG (50000 UNIT) PO CAPS: 50000.0000 [IU] | ORAL_CAPSULE | ORAL | 0 refills | Status: DC

## 2017-09-15 NOTE — Addendum Note (Signed)
Addended by: Monica Becton on: 09/15/2017 10:14 AM   Modules accepted: Orders

## 2017-09-15 NOTE — Assessment & Plan Note (Addendum)
Will catch him up on preventive measures at next visit. Adding metformin 1000 BID.

## 2017-09-16 MED ORDER — METFORMIN HCL 1000 MG PO TABS: 1000.0000 mg | ORAL_TABLET | Freq: Two times a day (BID) | ORAL | 3 refills | Status: DC

## 2017-09-16 NOTE — Addendum Note (Signed)
Addended by: Monica Becton on: 09/16/2017 05:36 PM   Modules accepted: Orders

## 2017-09-30 ENCOUNTER — Encounter: Payer: Self-pay | Admitting: Sports Medicine

## 2017-09-30 ENCOUNTER — Ambulatory Visit (INDEPENDENT_AMBULATORY_CARE_PROVIDER_SITE_OTHER): Payer: BLUE CROSS/BLUE SHIELD | Admitting: Sports Medicine

## 2017-09-30 ENCOUNTER — Ambulatory Visit (INDEPENDENT_AMBULATORY_CARE_PROVIDER_SITE_OTHER): Payer: BLUE CROSS/BLUE SHIELD

## 2017-09-30 DIAGNOSIS — S8992XD Unspecified injury of left lower leg, subsequent encounter: Secondary | ICD-10-CM

## 2017-09-30 DIAGNOSIS — M71562 Other bursitis, not elsewhere classified, left knee: Secondary | ICD-10-CM | POA: Diagnosis not present

## 2017-09-30 DIAGNOSIS — S83412D Sprain of medial collateral ligament of left knee, subsequent encounter: Secondary | ICD-10-CM | POA: Diagnosis not present

## 2017-09-30 DIAGNOSIS — W19XXXD Unspecified fall, subsequent encounter: Secondary | ICD-10-CM

## 2017-09-30 NOTE — Progress Notes (Signed)
  Subjective:    CC: Persistent knee pain  HPI: Is a pleasant 51 year old male, he had a fall in the bathtub a couple of weeks ago, had immediate pain, swelling, bruising, medial joint line symptoms, and symptoms and signs consistent with a meniscal tear.  We injected his knee about a week ago, he has no relief, worsening locking, catching, pain.  Past medical history:  Negative.  See flowsheet/record as well for more information.  Surgical history: Negative.  See flowsheet/record as well for more information.  Family history: Negative.  See flowsheet/record as well for more information.  Social history: Negative.  See flowsheet/record as well for more information.  Allergies, and medications have been entered into the medical record, reviewed, and no changes needed.   Review of Systems: No fevers, chills, night sweats, weight loss, chest pain, or shortness of breath.   Objective:    General: Well Developed, well nourished, and in no acute distress.  Neuro: Alert and oriented x3, extra-ocular muscles intact, sensation grossly intact.  HEENT: Normocephalic, atraumatic, pupils equal round reactive to light, neck supple, no masses, no lymphadenopathy, thyroid nonpalpable.  Skin: Warm and dry, no rashes. Cardiac: Regular rate and rhythm, no murmurs rubs or gallops, no lower extremity edema.  Respiratory: Clear to auscultation bilaterally. Not using accessory muscles, speaking in full sentences. Left knee: Normal to inspection with no erythema or effusion or obvious bony abnormalities. Tender to palpation at the medial joint line ROM normal in flexion and extension and lower leg rotation. Ligaments with solid consistent endpoints including ACL, PCL, LCL, MCL. There is reproduction of some of the pain with application of valgus stress, pain is referred to the proximal MCL origin He also has tenderness at the medial joint line with a positive McMurray sign, positive Thessaly sign and positive  Apley's sign. Non painful patellar compression. Patellar and quadriceps tendons unremarkable. Hamstring and quadriceps strength is normal.  Impression and Recommendations:    Left knee injury Suspect MCL disruption and medial meniscal tear, pain is persistent in spite of injection, conservative treatment, locking, significant mechanical symptoms, this will probably need an arthroscopy, we are going to proceed with MRI today. ___________________________________________ Ihor Austinhomas J. Benjamin Stainhekkekandam, M.D., ABFM., CAQSM. Primary Care and Sports Medicine [facility]  Adjunct Instructor of Family Medicine  [employer]

## 2017-09-30 NOTE — Assessment & Plan Note (Signed)
Suspect MCL disruption and medial meniscal tear, pain is persistent in spite of injection, conservative treatment, locking, significant mechanical symptoms, this will probably need an arthroscopy, we are going to proceed with MRI today.

## 2017-10-01 ENCOUNTER — Ambulatory Visit: Payer: BLUE CROSS/BLUE SHIELD | Admitting: Sports Medicine

## 2017-10-01 DIAGNOSIS — Z0189 Encounter for other specified special examinations: Secondary | ICD-10-CM

## 2017-10-11 ENCOUNTER — Ambulatory Visit: Payer: BLUE CROSS/BLUE SHIELD | Admitting: Sports Medicine

## 2017-10-11 ENCOUNTER — Encounter: Payer: Self-pay | Admitting: Sports Medicine

## 2017-10-11 DIAGNOSIS — G4700 Insomnia, unspecified: Secondary | ICD-10-CM | POA: Insufficient documentation

## 2017-10-11 DIAGNOSIS — G4701 Insomnia due to medical condition: Secondary | ICD-10-CM

## 2017-10-11 DIAGNOSIS — S8992XD Unspecified injury of left lower leg, subsequent encounter: Secondary | ICD-10-CM | POA: Diagnosis not present

## 2017-10-11 MED ORDER — SUVOREXANT 10 MG PO TABS: 1.0000 | ORAL_TABLET | Freq: Every day | ORAL | 0 refills | Status: DC

## 2017-10-11 MED ORDER — ATORVASTATIN CALCIUM 40 MG PO TABS: 40.0000 mg | ORAL_TABLET | Freq: Every day | ORAL | 3 refills | Status: DC

## 2017-10-11 NOTE — Assessment & Plan Note (Signed)
Belsomra trial offer, 10 mg. We can call him in some more if it works.

## 2017-10-11 NOTE — Assessment & Plan Note (Signed)
6 weeks post injury, MRI confirms MCL tear. Likely grade 2 based on exam, it was probably a grade 3 when it happened. Continue hinged knee brace, he will wear 24 hours a day, I am going to give him some Belsomra to help him sleep with the brace on. 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

## 2017-10-11 NOTE — Progress Notes (Signed)
  Subjective:    CC: Follow-up  HPI: This is a pleasant 51 year old male, he had a knee injury approximately 6 weeks ago, severe medial joint line pain, on exam we suspected an MCL sprain, his knee was injected, only a slight improvement.  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  Particularly difficult at night, not wearing his brace at night.  Past medical history:  Negative.  See flowsheet/record as well for more information.  Surgical history: Negative.  See flowsheet/record as well for more information.  Family history: Negative.  See flowsheet/record as well for more information.  Social history: Negative.  See flowsheet/record as well for more information.  Allergies, and medications have been entered into the medical record, reviewed, and no changes needed.   Review of Systems: No fevers, chills, night sweats, weight loss, chest pain, or shortness of breath.   Objective:    General: Well Developed, well nourished, and in no acute distress.  Neuro: Alert and oriented x3, extra-ocular muscles intact, sensation grossly intact.  HEENT: Normocephalic, atraumatic, pupils equal round reactive to light, neck supple, no masses, no lymphadenopathy, thyroid nonpalpable.  Skin: Warm and dry, no rashes. Cardiac: Regular rate and rhythm, no murmurs rubs or gallops, no lower extremity edema.  Respiratory: Clear to auscultation bilaterally. Not using accessory muscles, speaking in full sentences. Left knee: Normal to inspection with no erythema or effusion or obvious bony abnormalities. Still with some tenderness at the medial knee, distal insertion of the MCL and the pes anserine bursa ROM normal in flexion and extension and lower leg rotation. Ligaments with solid consistent endpoints including ACL, PCL, LCL, MCL. Negative Mcmurray's and provocative  meniscal tests. Non painful patellar compression. Patellar and quadriceps tendons unremarkable. Hamstring and quadriceps strength is normal.  MRI reviewed and shows edema in the distal MCL as well as the pes bursa  Impression and Recommendations:    Left knee injury 6 weeks post injury, MRI confirms MCL tear. Likely grade 2 based on exam, it was probably a grade 3 when it happened. Continue hinged knee brace, he will wear 24 hours a day, I am going to give him some Belsomra to help him sleep with the brace on. 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  Insomnia Belsomra trial offer, 10 mg. We can call him in some more if it works.  I spent 25 minutes with this patient, greater than 50% was face-to-face time counseling regarding the above diagnoses ___________________________________________ Ihor Austinhomas J. Benjamin Stainhekkekandam, M.D., ABFM., CAQSM. Primary Care and Sports Medicine [facility]  Adjunct Instructor of Family Medicine  [employer]

## 2017-10-28 ENCOUNTER — Ambulatory Visit: Payer: BLUE CROSS/BLUE SHIELD | Admitting: Sports Medicine

## 2018-01-06 ENCOUNTER — Encounter: Payer: Self-pay | Admitting: Physician Assistant

## 2018-01-06 ENCOUNTER — Ambulatory Visit: Payer: BLUE CROSS/BLUE SHIELD | Admitting: Physician Assistant

## 2018-01-06 VITALS — BP 130/85 | HR 61 | Temp 98.3°F | Wt 292.0 lb

## 2018-01-06 DIAGNOSIS — R1310 Dysphagia, unspecified: Secondary | ICD-10-CM | POA: Diagnosis not present

## 2018-01-06 DIAGNOSIS — E559 Vitamin D deficiency, unspecified: Secondary | ICD-10-CM

## 2018-01-06 DIAGNOSIS — E1165 Type 2 diabetes mellitus with hyperglycemia: Secondary | ICD-10-CM | POA: Diagnosis not present

## 2018-01-06 DIAGNOSIS — K140 Glossitis: Secondary | ICD-10-CM | POA: Diagnosis not present

## 2018-01-06 DIAGNOSIS — Z1321 Encounter for screening for nutritional disorder: Secondary | ICD-10-CM

## 2018-01-06 MED ORDER — ERGOCALCIFEROL 50 MCG (2000 UT) PO CAPS: 1.0000 | ORAL_CAPSULE | Freq: Every day | ORAL | Status: DC

## 2018-01-06 NOTE — Progress Notes (Signed)
HPI:                                                                Bobby Saunders is a 52 y.o. male who [facility] today for odynophagia  Pleasant 52 yo M with PMH of new-onset Type 2 DM presents with 4 weeks of persistent tongue pain. States his tongue also had white patches, which have resolved with Magic Mouthwash. He was seen in an urgent care about 2 weeks ago and was treated for Strep pharyngitis with Amoxicillin. States the magic mouthwash did not provide much relief for him to continue doing it.  Denies throat pain, dysphagia, dental pain, fever/chills, constitutional symptoms. He is a former smoker, quit in 2006. He denies recent use of tobacco products. His chart notes smokeless tobacco use.  Routine labs 3 months ago showed new diagnosisis of type 2 diabetes (A1c 7.0) and he was started on Metformin, which he has been taking for about 8 weeks.   No flowsheet data found.    Past Medical History:  Diagnosis Date  . Sleep apnea    cpap 6 yrs   Past Surgical History:  Procedure Laterality Date  . ELBOW SURGERY Bilateral 01 last   numerous lft , one right  . LUMBAR LAMINECTOMY/DECOMPRESSION MICRODISCECTOMY N/A 05/13/2014   Procedure: LUMBAR LAMINECTOMY/DECOMPRESSION MICRODISCECTOMY;  Surgeon: Emilee HeroMark Leonard Dumonski, MD;  Location: [facility] OR;  Service: Orthopedics;  Laterality: N/A;  Lumbar 3-4 decompression   Social History   Tobacco Use  . Smoking status: Former Smoker    Packs/day: 1.00    Years: 25.00    Pack years: 25.00    Last attempt to quit: 05/05/2005    Years since quitting: 12.6  . Smokeless tobacco: Current User  Substance Use Topics  . Alcohol use: No    Alcohol/week: 0.0 oz   family history includes Cancer in his maternal grandmother, maternal uncle, and sister; Hyperlipidemia in his mother; Hypertension in his mother.    ROS: negative except as noted in the HPI  Medications: Current  Outpatient Medications  Medication Sig Dispense Refill  . atorvastatin (LIPITOR) 40 MG tablet Take 1 tablet (40 mg total) daily by mouth. 90 tablet 3  . metFORMIN (GLUCOPHAGE) 1000 MG tablet Take 1 tablet (1,000 mg total) by mouth 2 (two) times daily with a meal. 180 tablet 3  . ranitidine (ZANTAC) 150 MG tablet Take 150 mg by mouth daily.    . Ergocalciferol 2000 units CAPS Take 1 capsule by mouth daily.     No current facility-administered medications for this visit.    No Known Allergies     Objective:  BP 130/85   Pulse 61   Temp 98.3 F (36.8 C) (Oral)   Wt 292 lb (132.5 kg)   BMI 41.90 kg/m  Gen:  alert, not ill-appearing, no distress, appropriate for age, obese male HEENT: head normocephalic without obvious abnormality, conjunctiva and cornea clear, oral mucosa pale and dry, tongue is dry and cracked and "beefy red," no lesions on the tongue or oral mucosa, oropharynx without erythema or edema, uvula midline, trachea midline Pulm: Normal work of breathing, normal phonation Neuro: alert and oriented x 3, no tremor MSK: extremities atraumatic, normal gait and station  No results found for this or any previous visit (from the past 72 hour(s)). No results found.    Assessment and Plan: 52 y.o. male with   1. Odynophagia - we discussed the association between diabetes and dental problems, including dry mouth and oral candidiasis. No evidence of thrush on exam today, but patient did recently complete oral antifungal rinse.   2. Glossitis - work-up as below - biotene dry mouth oral rinse daily - soft diet - avoid acidic foods (citrus, tomato sauce etc.) - brush with fluoride toothpaste twice a day - see dentist for cleanings twice a year and annual exam - keep diabetes follow-up with PCP - Vitamin B12 - VITAMIN D 25 Hydroxy (Vit-D Deficiency, Fractures) - Iron, TIBC and Ferritin Panel - CBC with Differential/Platelet - COMPLETE METABOLIC PANEL WITH GFR  3.  Encounter for vitamin deficiency screening - Vitamin B12  4. Vitamin D deficiency - VITAMIN D 25 Hydroxy (Vit-D Deficiency, Fractures) - Ergocalciferol 2000 units CAPS; Take 1 capsule by mouth daily.  5. Type 2 diabetes mellitus with hyperglycemia, without long-term current use of insulin (HCC) - CBC with Differential/Platelet - COMPLETE METABOLIC PANEL WITH GFR  Patient education and anticipatory guidance given Patient agrees with treatment plan Follow-up as needed if symptoms worsen or fail to improve  Levonne Hubert PA-C

## 2018-01-06 NOTE — Patient Instructions (Addendum)
- labs today - biotene dry mouth oral rinse daily - soft diet - avoid acidic foods (citrus, tomato sauce etc.) - brush with fluoride toothpaste twice a day - see dentist for cleanings twice a year and annual exam - keep diabetes follow-up with Dr. Herby Abraham  Glossitis Glossitis is inflammation of the tongue. This may be a stand-alone condition or it may be a symptom of a different condition that you have. Generally, glossitis goes away when its cause is identified and treated. Glossitis can be dangerous if it causes difficulty breathing. What are the causes? This condition can be caused by many different things. In some cases, the cause may not be known. Certain underlying conditions may cause glossitis, such as:  Viral, bacterial, or yeast infections.  Allergies.  Dysfunction of the skin and mucous membranes. This can happen with certain autoimmune disorders.  Abnormal tissue growths (tumors).  Lack of healthy red blood cells (anemia).  Movement of stomach acid into the tube that connects the mouth and the stomach (gastroesophageal reflux).  Lack of proper nutrition or certain vitamins.  Certain lifelong (chronic) medical conditions, such as diabetes.  Sometimes, glossitis may not be caused by an underlying condition. In these cases, glossitis may be caused by:  Use of tobacco products, such as cigarettes, chewing tobacco, or e-cigarettes.  Excessive alcohol use.  Tongue injury or irritation.  Certain medicines, such as medicines to treat cancer.  What increases the risk? This condition is more likely to develop in:  People who are 50 years or older.  Men.  People who are taking antibiotics or steroids, such as asthma medicines.  People who drink alcohol excessively.  People who use tobacco products, including cigarettes, chewing tobacco, or e-cigarettes.  People with chronic medical conditions, such as immune diseases or cancer.  People who do not brush or floss their  teeth regularly.  People who lack proper nutrition or are anemic.  What are the signs or symptoms? Symptoms of this condition vary depending on the cause. Symptoms may include:  Swelling of the tongue.  Pain and tenderness in the tongue. Sometimes, this condition is painless.  Changes in tongue color. The tongue may be pale or bright red.  Smooth areas on the tongue's surface.  A small mass of tissue (node) or white patch on the tongue.  Difficulty chewing, swallowing, or talking.  Difficulty breathing.  How is this diagnosed? This condition is diagnosed based on a physical exam and medical history. Your health care provider may ask you about your eating and drinking habits. You may have tests, including:  Blood tests.  Removal of a small amount of cells from the tongue that are examined under a microscope (biopsy).  You may be given the name of a dentist or a health care provider who specializes in ear, nose, and throat (ENT) problems (otolaryngologist). How is this treated? Treatment for this condition depends on the underlying cause and may include:  Following instructions from your health care provider about keeping your mouth clean and avoiding irritants that may have caused your condition or made it worse.  Nutritional therapy. Your health care provider may tell you to change your eating and drinking habits or take a nutritional supplement.  Managing underlying conditions that may have caused your glossitis.  Medicines, such as: ? Corticosteroids to reduce inflammation. ? Antibiotics if your condition was caused by an infection. ? Local anestheticsthat numb your tongue or mouth (local anesthetics).  Follow these instructions at home:  Keep your  teeth and mouth clean. This includes brushing and flossing frequently and having regular dental checkups.  If you wear dentures or dental braces, work with your dentist to make sure they fit correctly.  Eat healthy  foods. Follow instructions from your health care provider about eating or drinking restrictions.  Avoid tobacco products, including cigarettes, chewing tobacco, or e-cigarettes. If you need help quitting, ask your health care provider.  Avoid excessive alcohol use.  Avoid any irritants that may have caused your condition or made it worse, such as chemicals or certain foods.  Keep all follow-up visits as told by your health care provider. This is important. Contact a health care provider if:  You have a fever.  You develop new symptoms.  You have symptoms that do not get better with medicine or get worse.  You have symptoms that do not go away after 10 days.  You cannot eat or drink because of your pain. Get help right away if:  You have severe pain or swelling.  You have difficulty breathing, swallowing, or talking. This information is not intended to replace advice given to you by your health care provider. Make sure you discuss any questions you have with your health care provider. Document Released: 11/09/2002 Document Revised: 04/26/2016 Document Reviewed: 04/06/2015 Elsevier Interactive Patient Education  2018 [employer].

## 2018-01-07 ENCOUNTER — Encounter: Payer: Self-pay | Admitting: Physician Assistant

## 2018-01-07 DIAGNOSIS — E559 Vitamin D deficiency, unspecified: Secondary | ICD-10-CM

## 2018-01-07 HISTORY — DX: Vitamin D deficiency, unspecified: E55.9

## 2018-01-07 LAB — CBC WITH DIFFERENTIAL/PLATELET
BASOS ABS: 87 {cells}/uL (ref 0–200)
Basophils Relative: 1.1 %
EOS ABS: 237 {cells}/uL (ref 15–500)
Eosinophils Relative: 3 %
HEMATOCRIT: 47.8 % (ref 38.5–50.0)
Hemoglobin: 16.6 g/dL (ref 13.2–17.1)
LYMPHS ABS: 1509 {cells}/uL (ref 850–3900)
MCH: 31 pg (ref 27.0–33.0)
MCHC: 34.7 g/dL (ref 32.0–36.0)
MCV: 89.3 fL (ref 80.0–100.0)
MPV: 11.7 fL (ref 7.5–12.5)
Monocytes Relative: 10.4 %
NEUTROS PCT: 66.4 %
Neutro Abs: 5246 cells/uL (ref 1500–7800)
Platelets: 228 10*3/uL (ref 140–400)
RBC: 5.35 10*6/uL (ref 4.20–5.80)
RDW: 12.4 % (ref 11.0–15.0)
Total Lymphocyte: 19.1 %
WBC: 7.9 10*3/uL (ref 3.8–10.8)
WBCMIX: 822 {cells}/uL (ref 200–950)

## 2018-01-07 LAB — COMPLETE METABOLIC PANEL WITH GFR
AG RATIO: 1.8 (calc) (ref 1.0–2.5)
ALT: 21 U/L (ref 9–46)
AST: 21 U/L (ref 10–35)
Albumin: 4.2 g/dL (ref 3.6–5.1)
Alkaline phosphatase (APISO): 52 U/L (ref 40–115)
BUN: 14 mg/dL (ref 7–25)
CO2: 28 mmol/L (ref 20–32)
Calcium: 9.6 mg/dL (ref 8.6–10.3)
Chloride: 105 mmol/L (ref 98–110)
Creat: 1.14 mg/dL (ref 0.70–1.33)
GFR, EST NON AFRICAN AMERICAN: 74 mL/min/{1.73_m2} (ref >=60)
GFR, Est African American: 86 mL/min/{1.73_m2} (ref >=60)
GLOBULIN: 2.3 g/dL (ref 1.9–3.7)
Glucose, Bld: 115 mg/dL — ABNORMAL HIGH (ref 65–99)
POTASSIUM: 4.7 mmol/L (ref 3.5–5.3)
SODIUM: 142 mmol/L (ref 135–146)
Total Bilirubin: 0.6 mg/dL (ref 0.2–1.2)
Total Protein: 6.5 g/dL (ref 6.1–8.1)

## 2018-01-07 LAB — IRON,TIBC AND FERRITIN PANEL
%SAT: 27 % (calc) (ref 15–60)
FERRITIN: 150 ng/mL (ref 20–380)
IRON: 89 ug/dL (ref 50–180)
TIBC: 332 mcg/dL (calc) (ref 250–425)

## 2018-01-07 LAB — VITAMIN B12: Vitamin B-12: 329 pg/mL (ref 200–1100)

## 2018-01-07 LAB — VITAMIN D 25 HYDROXY (VIT D DEFICIENCY, FRACTURES): Vit D, 25-Hydroxy: 23 ng/mL — ABNORMAL LOW (ref 30–100)

## 2018-01-07 NOTE — Progress Notes (Signed)
Bobby Saunders,  Apart from your vitamin D level being low, there are no lab abnormalities to explain your tongue pain.  I would continue a daily vitamin D3 supplement.  Follow the treatment plan we discussed and keep your routine follow-up with your dentist and Dr. Karie Schwalbe!  Best, Vinetta Bergamoharley

## 2018-01-08 ENCOUNTER — Ambulatory Visit: Payer: BLUE CROSS/BLUE SHIELD | Admitting: Family Medicine

## 2018-02-03 ENCOUNTER — Ambulatory Visit: Payer: BLUE CROSS/BLUE SHIELD | Admitting: Physician Assistant

## 2018-03-11 ENCOUNTER — Encounter: Payer: Self-pay | Admitting: Sports Medicine

## 2018-03-11 ENCOUNTER — Ambulatory Visit: Payer: BLUE CROSS/BLUE SHIELD | Admitting: Sports Medicine

## 2018-03-11 VITALS — BP 125/77 | HR 69 | Resp 18 | Wt 285.0 lb

## 2018-03-11 DIAGNOSIS — Z23 Encounter for immunization: Secondary | ICD-10-CM

## 2018-03-11 DIAGNOSIS — E1165 Type 2 diabetes mellitus with hyperglycemia: Secondary | ICD-10-CM | POA: Diagnosis not present

## 2018-03-11 DIAGNOSIS — E119 Type 2 diabetes mellitus without complications: Secondary | ICD-10-CM

## 2018-03-11 LAB — POCT GLYCOSYLATED HEMOGLOBIN (HGB A1C): Hemoglobin A1C: 5.8

## 2018-03-11 MED ORDER — NYSTATIN 100000 UNIT/ML MT SUSP: 500000.0000 [IU] | Freq: Four times a day (QID) | OROMUCOSAL | 0 refills | Status: DC

## 2018-03-11 NOTE — Patient Instructions (Signed)
Oral Thrush, Adult Oral thrush, also called oral candidiasis, is a fungal infection that develops in the mouth and throat and on the tongue. It causes white patches to form on the mouth and tongue. Thrush is most common in older adults, but it can occur at any age. Many cases of thrush are mild, but this infection can also be serious. Thrush can be a repeated (recurrent) problem for certain people who have a weak body defense system (immune system). The weakness can be caused by chronic illnesses, or by taking medicines that limit the body's ability to fight infection. If a person has difficulty fighting infection, the fungus that causes thrush can spread through the body. This can cause life-threatening blood or organ infections. What are the causes? This condition is caused by a fungus (yeast) called Candida albicans.  This fungus is normally present in small amounts in the mouth and on other mucous membranes. It usually causes no harm.  If conditions are present that allow the fungus to grow without control, it invades surrounding tissues and becomes an infection.  Other Candida species can also lead to thrush (rare).  What increases the risk? This condition is more likely to develop in:  People with a weakened immune system.  Older adults.  People with HIV (human immunodeficiency virus).  People with diabetes.  People with dry mouth (xerostomia).  Pregnant women.  People with poor dental care, especially people who have false teeth.  People who use antibiotic medicines.  What are the signs or symptoms? Symptoms of this condition can vary from mild and moderate to severe and persistent. Symptoms may include:  A burning feeling in the mouth and throat. This can occur at the start of a thrush infection.  White patches that stick to the mouth and tongue. The tissue around the patches may be red, raw, and painful. If rubbed (during tooth brushing, for example), the patches and the  tissue of the mouth may bleed easily.  A bad taste in the mouth or difficulty tasting foods.  A cottony feeling in the mouth.  Pain during eating and swallowing.  Poor appetite.  Cracking at the corners of the mouth.  How is this diagnosed? This condition is diagnosed based on:  Physical exam. Your health care provider will look in your mouth.  Health history. Your health care provider will ask you questions about your health.  How is this treated? This condition is treated with medicines called antifungals, which prevent the growth of fungi. These medicines are either applied directly to the affected area (topical) or swallowed (oral). The treatment will depend on the severity of the condition. Mild thrush Mild cases of thrush may clear up with the use of an antifungal mouth rinse or lozenges. Treatment usually lasts about 14 days. Moderate to severe thrush  More severe thrush infections that have spread to the esophagus are treated with an oral antifungal medicine. A topical antifungal medicine may also be used.  For some severe infections, treatment may need to continue for more than 14 days.  Oral antifungal medicines are rarely used during pregnancy because they may be harmful to the unborn child. If you are pregnant, talk with your health care provider about options for treatment. Persistent or recurrent thrush For cases of thrush that do not go away or keep coming back:  Treatment may be needed twice as long as the symptoms last.  Treatment will include both oral and topical antifungal medicines.  People with a weakened immune   system can take an antifungal medicine on a continuous basis to prevent thrush infections.  It is important to treat conditions that make a person more likely to get thrush, such as diabetes or HIV. Follow these instructions at home: Medicines  Take over-the-counter and prescription medicines only as told by your health care provider.  Talk  with your health care provider about an over-the-counter medicine called gentian violet, which kills bacteria and fungi. Relieving soreness and discomfort To help reduce the discomfort of thrush:  Drink cold liquids such as water or iced tea.  Try flavored ice treats or frozen juices.  Eat foods that are easy to swallow, such as gelatin, ice cream, or custard.  Try drinking from a straw if the patches in your mouth are painful.  General instructions  Eat plain, unflavored yogurt as directed by your health care provider. Check the label to make sure the yogurt contains live cultures. This yogurt can help healthy bacteria to grow in the mouth and can stop the growth of the fungus that causes thrush.  If you wear dentures, remove the dentures before going to bed, brush them vigorously, and soak them in a cleaning solution as directed by your health care provider.  Rinse your mouth with a warm salt-water mixture several times a day. To make a salt-water mixture, completely dissolve 1/2-1 tsp of salt in 1 cup of warm water. Contact a health care provider if:  Your symptoms are getting worse or are not improving within 7 days of starting treatment.  You have symptoms of a spreading infection, such as white patches on the skin outside of the mouth. This information is not intended to replace advice given to you by your health care provider. Make sure you discuss any questions you have with your health care provider. Document Released: 08/14/2004 Document Revised: 08/13/2016 Document Reviewed: 08/13/2016 Elsevier Interactive Patient Education  2017 Elsevier Inc[employer]  

## 2018-03-11 NOTE — Assessment & Plan Note (Signed)
Likely thrush. A1c controlled. Foot exam today. Needs eye exam. Pneumococcal 23. Eye exam is in 2 weeks. Needs urine microalbumin.

## 2018-03-11 NOTE — Progress Notes (Signed)
Subjective:    CC: F/u on metformin use  HPI:  Bobby Saunders is a 52yo male that presents today to follow up on metformin use and  with a cc of three months of oral thrush and associated symptoms of burning and tingling sensation on the inside of both cheeks and tongue proceeding use of metformin. Pt denies any associated twitching of cheek .  Pt reports that he has been able to take the metformin as prescribed and reports that he noticed a decrease of the white plaque coating his tongue with the use of  once daily ingestion of Vitamin D.  Pt reports that while vitamin  but  associated symptoms has not completely dissipated it. Pt endorses that burning sensation has improved to a tingling  Sensation on tongue and inner cheeks, bilaterally.    I reviewed the past medical history, family history, social history, surgical history, and allergies today and no changes were needed.  Please see the problem list section below in epic for further details.  Past Medical History: Past Medical History:  Diagnosis Date  . Sleep apnea    cpap 6 yrs  . Vitamin D insufficiency 01/07/2018   Past Surgical History: Past Surgical History:  Procedure Laterality Date  . ELBOW SURGERY Bilateral 01 last   numerous lft , one right  . LUMBAR LAMINECTOMY/DECOMPRESSION MICRODISCECTOMY N/A 05/13/2014   Procedure: LUMBAR LAMINECTOMY/DECOMPRESSION MICRODISCECTOMY;  Surgeon: Emilee Hero, MD;  Location: [facility] OR;  Service: Orthopedics;  Laterality: N/A;  Lumbar 3-4 decompression   Social History: Social History   Socioeconomic History  . Marital status: Married    Spouse name: Not on file  . Number of children: Not on file  . Years of education: Not on file  . Highest education level: Not on file  Occupational History  . Not on file  Social Needs  . Financial resource strain: Not on file  . Food insecurity:    Worry: Not on file    Inability: Not on file  . Transportation needs:    Medical: Not on file      Non-medical: Not on file  Tobacco Use  . Smoking status: Former Smoker    Packs/day: 1.00    Years: 25.00    Pack years: 25.00    Last attempt to quit: 05/05/2005    Years since quitting: 12.8  . Smokeless tobacco: Current User  Substance and Sexual Activity  . Alcohol use: No    Alcohol/week: 0.0 oz  . Drug use: No  . Sexual activity: Yes  Lifestyle  . Physical activity:    Days per week: Not on file    Minutes per session: Not on file  . Stress: Not on file  Relationships  . Social connections:    Talks on phone: Not on file    Gets together: Not on file    Attends religious service: Not on file    Active member of club or organization: Not on file    Attends meetings of clubs or organizations: Not on file    Relationship status: Not on file  Other Topics Concern  . Not on file  Social History Narrative  . Not on file   Family History: Family History  Problem Relation Age of Onset  . Cancer Sister        colon  . Hyperlipidemia Mother   . Hypertension Mother   . Cancer Maternal Uncle   . Cancer Maternal Grandmother   . Sudden death Neg Hx   .  Heart attack Neg Hx   . Diabetes Neg Hx    Allergies: No Known Allergies Medications: See med rec.  Review of Systems: No fevers, chills, night sweats, weight loss, chest pain, or shortness of breath.   Objective:     General: Well Developed, well nourished, and in no acute distress.  Neuro: Alert and oriented x3, extra-ocular muscles intact, sensation grossly intact.  HEENT: Normocephalic, atraumatic, pupils equal round reactive to light, neck supple, no masses, no lymphadenopathy, thyroid nonpalpable.  Skin: Warm and dry, no rashes. Cardiac: Regular rate and rhythm, no murmurs rubs or gallops, no lower extremity edema.  Respiratory: Clear to auscultation bilaterally. Not using accessory muscles, speaking in full sentences.  Foot Exam Normal sensation across metartarsals heads, great toe No abnormal findings on  monofilament sensation test on bottom of foot.  No visible erythema or swelling. Range of motion is full in all directions. Strength is 5/5 in all directions. No hallux valgus. No pes cavus or pes planus. No abnormal callus noted. No pain over the navicular prominence, or base of fifth metatarsal. No tenderness to palpation of the calcaneal insertion of plantar fascia. No pain at the Achilles insertion. No pain over the calcaneal bursa. No pain of the retrocalcaneal bursa. No tenderness to palpation over the tarsals, metatarsals, or phalanges. No hallux rigidus or limitus. No tenderness palpation over interphalangeal joints. No pain with compression of the metatarsal heads. Neurovascularly intact distally.   Impression and Recommendations:    Assessment-Oral Thrush  Rinse 4 times daily with nystatin oral suspension  Assessment-PMH of Elevated Hemoglobin A1C Measured Hemoglobin A1C today - 5.8 Continue current regimen of Metformin; Measure microalbuminuria levels.  f/u in 6 months  Pt reported that he is scheduled to receive his annual eye exam on Wednesday the 17th   Assessment-Maintenance Pneumococcal 23 Vaccine   ___________________________________________ Ihor Austinhomas J. Benjamin Stainhekkekandam, M.D., ABFM., CAQSM. Primary Care and Sports Medicine  [facility]  Adjunct Instructor of Family Medicine  [employer]

## 2018-03-11 NOTE — Progress Notes (Signed)
Subjective:    CC: Follow-up  HPI: Bobby Saunders returns, he has had a whitish plaque on his mouth and lips, slight burning sensation.  Improved slightly.  He is also here for a blood pressure and diabetes check.  I reviewed the past medical history, family history, social history, surgical history, and allergies today and no changes were needed.  Please see the problem list section below in epic for further details.  Past Medical History: Past Medical History:  Diagnosis Date  . Sleep apnea    cpap 6 yrs  . Vitamin D insufficiency 01/07/2018   Past Surgical History: Past Surgical History:  Procedure Laterality Date  . ELBOW SURGERY Bilateral 01 last   numerous lft , one right  . LUMBAR LAMINECTOMY/DECOMPRESSION MICRODISCECTOMY N/A 05/13/2014   Procedure: LUMBAR LAMINECTOMY/DECOMPRESSION MICRODISCECTOMY;  Surgeon: Emilee HeroMark Leonard Dumonski, MD;  [facility];  Service: Orthopedics;  Laterality: N/A;  Lumbar 3-4 decompression   Social History: Social History   Socioeconomic History  . Marital status: Married    Spouse name: Not on file  . Number of children: Not on file  . Years of education: Not on file  . Highest education level: Not on file  Occupational History  . Not on file  Social Needs  . Financial resource strain: Not on file  . Food insecurity:    Worry: Not on file    Inability: Not on file  . Transportation needs:    Medical: Not on file    Non-medical: Not on file  Tobacco Use  . Smoking status: Former Smoker    Packs/day: 1.00    Years: 25.00    Pack years: 25.00    Last attempt to quit: 05/05/2005    Years since quitting: 12.8  . Smokeless tobacco: Current User  Substance and Sexual Activity  . Alcohol use: No    Alcohol/week: 0.0 oz  . Drug use: No  . Sexual activity: Yes  Lifestyle  . Physical activity:    Days per week: Not on file    Minutes per session: Not on file  . Stress: Not on file  Relationships  . Social connections:    Talks on phone: Not  on file    Gets together: Not on file    Attends religious service: Not on file    Active member of club or organization: Not on file    Attends meetings of clubs or organizations: Not on file    Relationship status: Not on file  Other Topics Concern  . Not on file  Social History Narrative  . Not on file   Family History: Family History  Problem Relation Age of Onset  . Cancer Sister        colon  . Hyperlipidemia Mother   . Hypertension Mother   . Cancer Maternal Uncle   . Cancer Maternal Grandmother   . Sudden death Neg Hx   . Heart attack Neg Hx   . Diabetes Neg Hx    Allergies: No Known Allergies Medications: See med rec.  Review of Systems: No fevers, chills, night sweats, weight loss, chest pain, or shortness of breath.   Objective:    General: Well Developed, well nourished, and in no acute distress.  Neuro: Alert and oriented x3, extra-ocular muscles intact, sensation grossly intact.  HEENT: Normocephalic, atraumatic, pupils equal round reactive to light, neck supple, no masses, no lymphadenopathy, thyroid nonpalpable.  Mild oral thrush. Skin: Warm and dry, no rashes. Cardiac: Regular rate and rhythm, no  murmurs rubs or gallops, no lower extremity edema.  Respiratory: Clear to auscultation bilaterally. Not using accessory muscles, speaking in full sentences.  Impression and Recommendations:    Controlled type 2 diabetes mellitus without complication, without long-term current use of insulin (HCC) Likely thrush. A1c controlled. Foot exam today. Needs eye exam. Pneumococcal 23. Eye exam is in 2 weeks. Needs urine microalbumin. ___________________________________________ [name]. Benjamin Stain, M.D., ABFM., CAQSM. Primary Care and Sports Medicine [facility]  Adjunct Instructor of Family Medicine  [employer]

## 2018-03-11 NOTE — Addendum Note (Signed)
Addended by: Baird KayUGLAS, Nasier Thumm M on: 03/11/2018 11:21 AM   Modules accepted: Orders

## 2018-03-12 LAB — MICROALBUMIN / CREATININE URINE RATIO
Creatinine, Urine: 158 mg/dL (ref 20–320)
Microalb Creat Ratio: 3 ug/mg{creat} (ref <30)
Microalb, Ur: 0.4 mg/dL

## 2018-08-18 ENCOUNTER — Ambulatory Visit (INDEPENDENT_AMBULATORY_CARE_PROVIDER_SITE_OTHER): Payer: BLUE CROSS/BLUE SHIELD

## 2018-08-18 ENCOUNTER — Ambulatory Visit: Payer: BLUE CROSS/BLUE SHIELD | Admitting: Sports Medicine

## 2018-08-18 ENCOUNTER — Encounter: Payer: Self-pay | Admitting: Sports Medicine

## 2018-08-18 DIAGNOSIS — R21 Rash and other nonspecific skin eruption: Secondary | ICD-10-CM | POA: Insufficient documentation

## 2018-08-18 DIAGNOSIS — M79671 Pain in right foot: Secondary | ICD-10-CM

## 2018-08-18 MED ORDER — CLOTRIMAZOLE-BETAMETHASONE 1-0.05 % EX CREA: 1.0000 "application " | TOPICAL_CREAM | Freq: Two times a day (BID) | CUTANEOUS | 0 refills | Status: DC

## 2018-08-18 NOTE — Progress Notes (Signed)
Subjective:    CC: Foot pain  HPI: [name] is a pleasant 52 year old male, for the past month and a half he is in pain he localizes at the proximal shaft of his right fifth metatarsal, worse with weightbearing, no trauma, symptoms are moderate, persistent.  Localized, no radiation.  I reviewed the past medical history, family history, social history, surgical history, and allergies today and no changes were needed.  Please see the problem list section below in epic for further details.  Past Medical History: Past Medical History:  Diagnosis Date  . Sleep apnea    cpap 6 yrs  . Vitamin D insufficiency 01/07/2018   Past Surgical History: Past Surgical History:  Procedure Laterality Date  . ELBOW SURGERY Bilateral 01 last   numerous lft , one right  . LUMBAR LAMINECTOMY/DECOMPRESSION MICRODISCECTOMY N/A 05/13/2014   Procedure: LUMBAR LAMINECTOMY/DECOMPRESSION MICRODISCECTOMY;  Surgeon: Emilee Hero, MD;  Location[facility];  Service: Orthopedics;  Laterality: N/A;  Lumbar 3-4 decompression   Social History: Social History   Socioeconomic History  . Marital status: Married    Spouse name: Not on file  . Number of children: Not on file  . Years of education: Not on file  . Highest education level: Not on file  Occupational History  . Not on file  Social Needs  . Financial resource strain: Not on file  . Food insecurity:    Worry: Not on file    Inability: Not on file  . Transportation needs:    Medical: Not on file    Non-medical: Not on file  Tobacco Use  . Smoking status: Former Smoker    Packs/day: 1.00    Years: 25.00    Pack years: 25.00    Last attempt to quit: 05/05/2005    Years since quitting: 13.2  . Smokeless tobacco: Current User  Substance and Sexual Activity  . Alcohol use: No    Alcohol/week: 0.0 standard drinks  . Drug use: No  . Sexual activity: Yes  Lifestyle  . Physical activity:    Days per week: Not on file    Minutes per session: Not on  file  . Stress: Not on file  Relationships  . Social connections:    Talks on phone: Not on file    Gets together: Not on file    Attends religious service: Not on file    Active member of club or organization: Not on file    Attends meetings of clubs or organizations: Not on file    Relationship status: Not on file  Other Topics Concern  . Not on file  Social History Narrative  . Not on file   Family History: Family History  Problem Relation Age of Onset  . Cancer Sister        colon  . Hyperlipidemia Mother   . Hypertension Mother   . Cancer Maternal Uncle   . Cancer Maternal Grandmother   . Sudden death Neg Hx   . Heart attack Neg Hx   . Diabetes Neg Hx    Allergies: No Known Allergies Medications: See med rec.  Review of Systems: No fevers, chills, night sweats, weight loss, chest pain, or shortness of breath.   Objective:    General: Well Developed, well nourished, and in no acute distress.  Neuro: Alert and oriented x3, extra-ocular muscles intact, sensation grossly intact.  HEENT: Normocephalic, atraumatic, pupils equal round reactive to light, neck supple, no masses, no lymphadenopathy, thyroid nonpalpable.  Skin: Warm and  dry, there is a papular rash at the right first dorsal interosseous space on the hand. Cardiac: Regular rate and rhythm, no murmurs rubs or gallops, no lower extremity edema.  Respiratory: Clear to auscultation bilaterally. Not using accessory muscles, speaking in full sentences. Right foot: No visible erythema or swelling. Range of motion is full in all directions. Strength is 5/5 in all directions. No hallux valgus. No pes cavus or pes planus. No abnormal callus noted. No pain over the navicular prominence, or base of fifth metatarsal. No tenderness to palpation of the calcaneal insertion of plantar fascia. No pain at the Achilles insertion. No pain over the calcaneal bursa. No pain of the retrocalcaneal bursa. Tender to palpation at  the proximal shaft of the fifth metatarsal. No hallux rigidus or limitus. No tenderness palpation over interphalangeal joints. No pain with compression of the metatarsal heads. Neurovascularly intact distally.  X-rays personally reviewed, no periosteal reaction, there is evidence of an old ossicle proximal to the fifth metatarsal base, possible old fracture.  Well-corticated not likely symptomatic.  Impression and Recommendations:    Right foot pain 1.5 months of pain at the fifth metatarsal shaft proximally. Suspect stress injury, postop shoe for 1 month, x-rays. Return in a month, MRI if no better.  Skin rash Unclear etiology. Fungal versus dyshidrotic eczema, adding topical Lotrisone, biopsy if persistent after 2 weeks. ___________________________________________ Ihor Austinhomas J. Benjamin Stainhekkekandam, M.D., ABFM., CAQSM. Primary Care and Sports Medicine [facility]  Adjunct Instructor of Family Medicine  [employer]

## 2018-08-18 NOTE — Assessment & Plan Note (Signed)
1.5 months of pain at the fifth metatarsal shaft proximally. Suspect stress injury, postop shoe for 1 month, x-rays. Return in a month, MRI if no better.

## 2018-08-18 NOTE — Assessment & Plan Note (Signed)
Unclear etiology. Fungal versus dyshidrotic eczema, adding topical Lotrisone, biopsy if persistent after 2 weeks.

## 2018-09-10 ENCOUNTER — Ambulatory Visit: Payer: BLUE CROSS/BLUE SHIELD | Admitting: Sports Medicine

## 2018-09-23 ENCOUNTER — Ambulatory Visit: Payer: BLUE CROSS/BLUE SHIELD | Admitting: Sports Medicine

## 2018-10-29 IMAGING — DX DG FOOT COMPLETE 3+V*R*
3 series · 3 of 3 positions shown · non-contrast
Comparison: None.

CLINICAL DATA: Right lateral foot pain for 6 weeks. No known
injury.

EXAM:
RIGHT FOOT COMPLETE - 3+ VIEW

[foot ap]
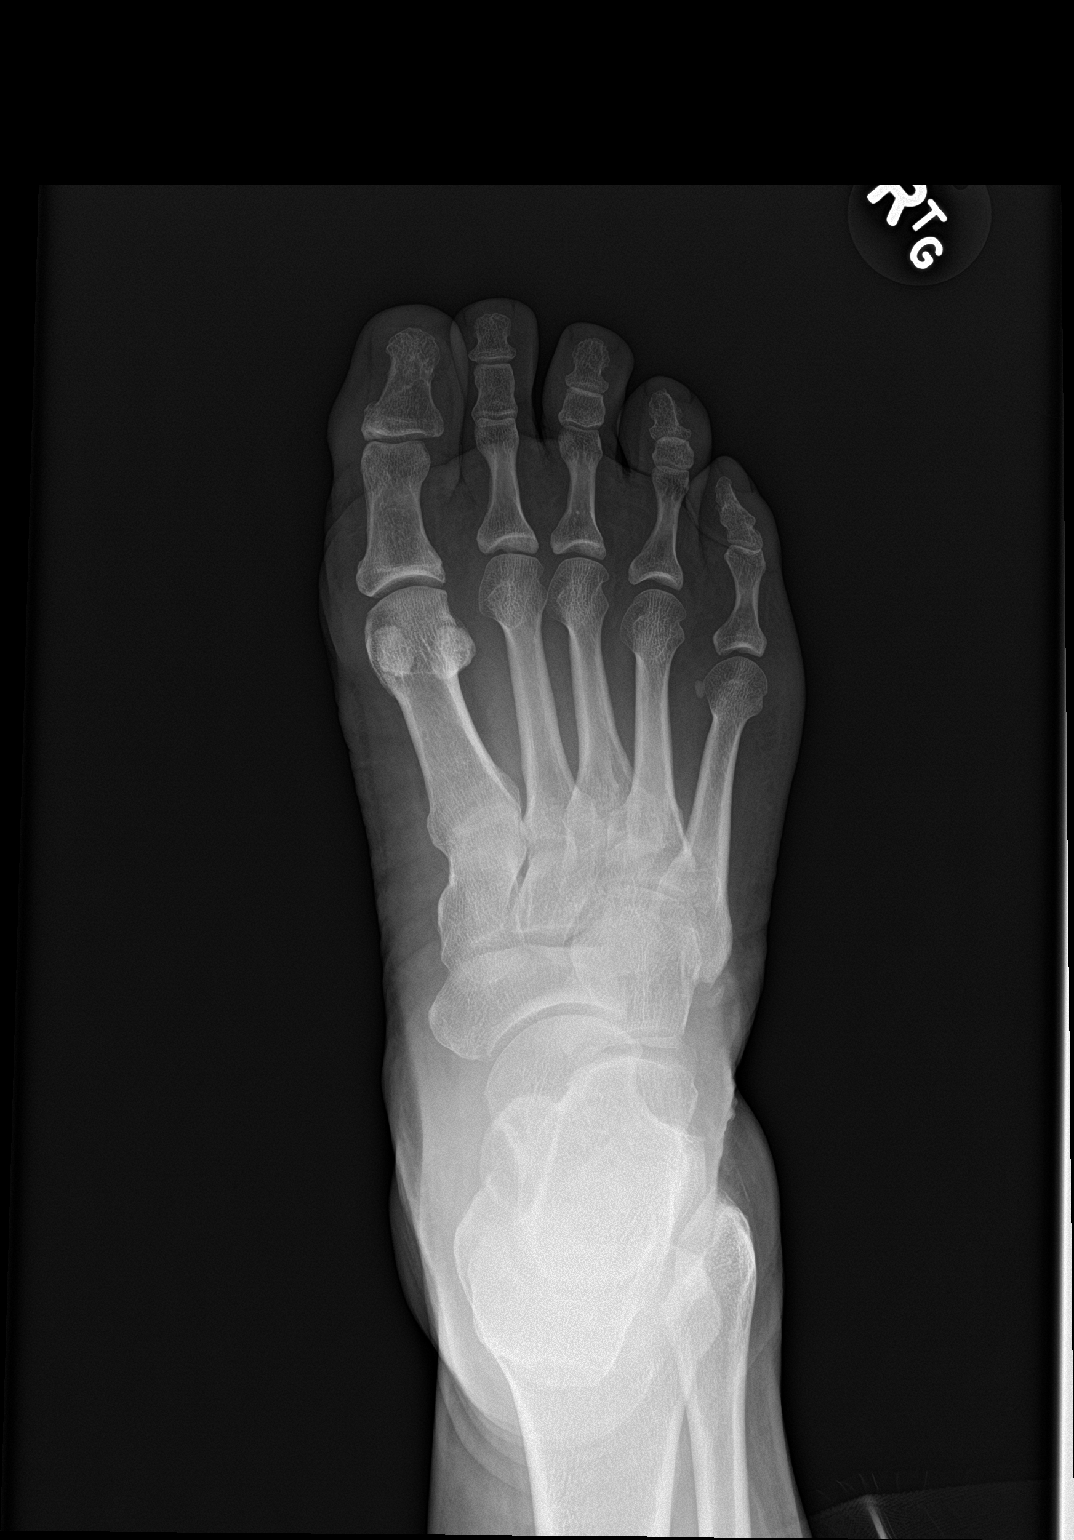

[foot obl]
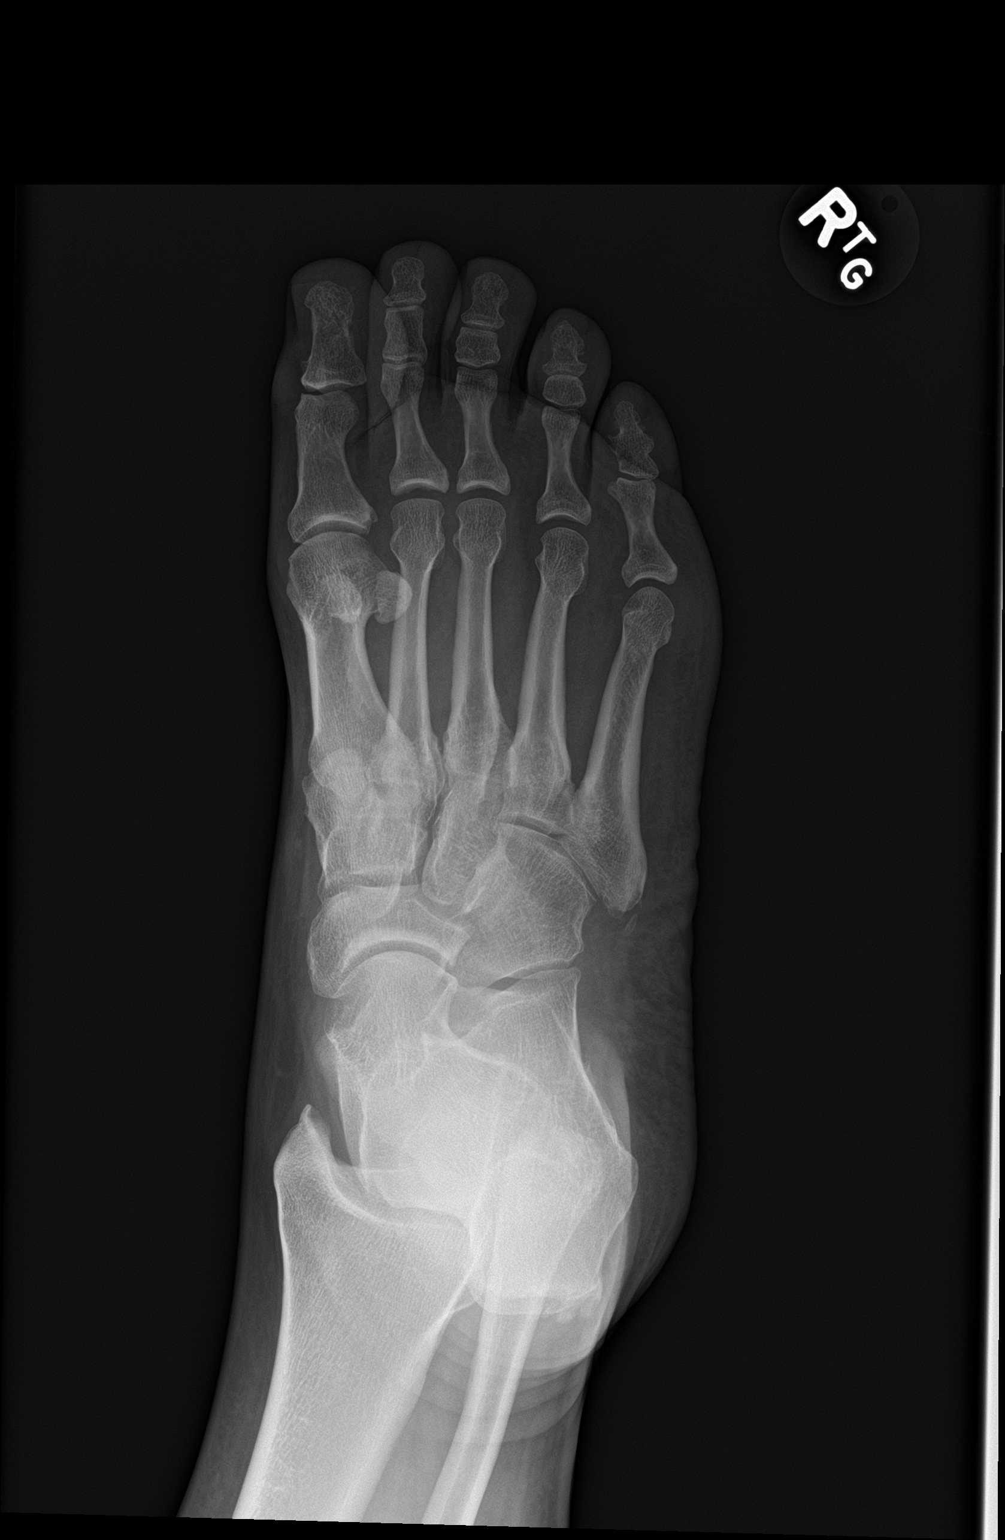

[foot lat]
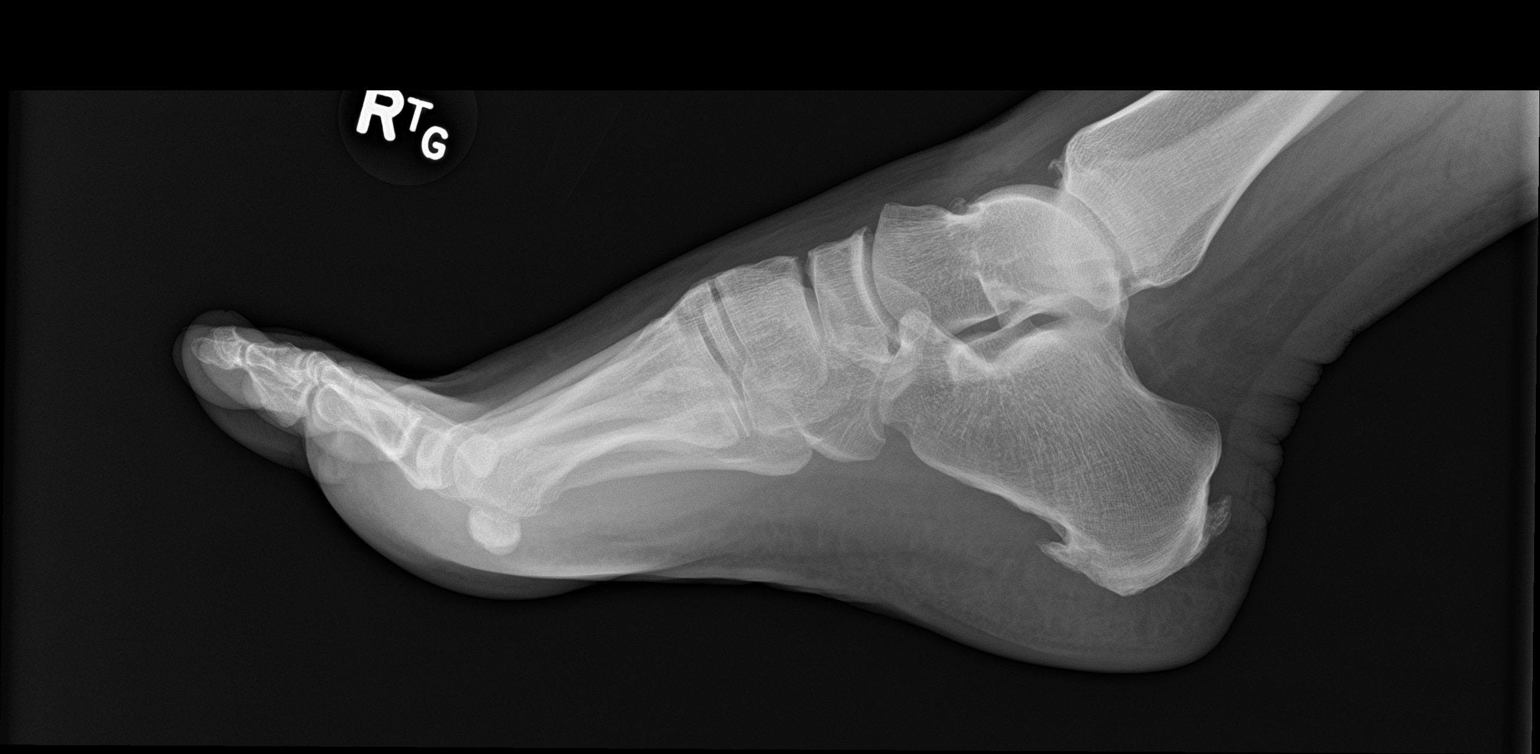

[3 of 3 positions shown; findings below may reference images not displayed]

FINDINGS: Plantar and posterior calcaneal spurs. Well corticated bone fragment
adjacent to the base of the 5th metatarsal, likely related to old
injury. No acute fracture, subluxation or dislocation. Soft tissues
are intact.
IMPRESSION: No acute bony abnormality.

## 2018-12-14 ENCOUNTER — Other Ambulatory Visit: Payer: Self-pay | Admitting: Sports Medicine

## 2019-03-20 ENCOUNTER — Other Ambulatory Visit: Payer: Self-pay | Admitting: Orthopedic Surgery

## 2019-03-20 DIAGNOSIS — M48062 Spinal stenosis, lumbar region with neurogenic claudication: Secondary | ICD-10-CM

## 2019-03-27 ENCOUNTER — Ambulatory Visit
Admission: RE | Admit: 2019-03-27 | Discharge: 2019-03-27 | Disposition: A | Discharge status: Home / Self Care | Payer: BLUE CROSS/BLUE SHIELD | Source: Ambulatory Visit | Attending: Orthopedic Surgery | Admitting: Orthopedic Surgery

## 2019-03-27 ENCOUNTER — Other Ambulatory Visit: Payer: Self-pay

## 2019-03-27 DIAGNOSIS — M48062 Spinal stenosis, lumbar region with neurogenic claudication: Secondary | ICD-10-CM

## 2019-03-29 ENCOUNTER — Other Ambulatory Visit: Payer: Self-pay | Admitting: Sports Medicine

## 2019-03-29 DIAGNOSIS — E119 Type 2 diabetes mellitus without complications: Secondary | ICD-10-CM

## 2019-03-30 ENCOUNTER — Encounter: Payer: Self-pay | Admitting: Sports Medicine

## 2019-03-30 NOTE — Telephone Encounter (Signed)
Pt advised via MyChart message. °

## 2019-03-30 NOTE — Telephone Encounter (Signed)
[name] has not had an encounter in over 6 months, lets set him up for either an in person or video encounter before refilling the medication

## 2019-05-04 ENCOUNTER — Encounter: Payer: Self-pay | Admitting: Sports Medicine

## 2019-05-15 ENCOUNTER — Ambulatory Visit (INDEPENDENT_AMBULATORY_CARE_PROVIDER_SITE_OTHER): Payer: BC Managed Care – PPO | Admitting: Sports Medicine

## 2019-05-15 ENCOUNTER — Encounter: Payer: Self-pay | Admitting: Sports Medicine

## 2019-05-15 DIAGNOSIS — E119 Type 2 diabetes mellitus without complications: Secondary | ICD-10-CM

## 2019-05-15 MED ORDER — METFORMIN HCL 1000 MG PO TABS: 1000.0000 mg | ORAL_TABLET | Freq: Two times a day (BID) | ORAL | 3 refills | Status: DC

## 2019-05-15 MED ORDER — ATORVASTATIN CALCIUM 40 MG PO TABS: 40.0000 mg | ORAL_TABLET | Freq: Every day | ORAL | 3 refills | Status: DC

## 2019-05-15 NOTE — Assessment & Plan Note (Signed)
Discontinue metformin, it is on recall. We can recheck his hemoglobin A1c in a couple of months to see if he even needs it, his last A1c was 5.8%. He is up-to-date now on eye exam, he will need some diabetic screening labs.

## 2019-05-15 NOTE — Progress Notes (Signed)
Virtual Visit via WebEx/MyChart   I connected with  Bobby Saunders  on 05/15/19 via WebEx/MyChart/Doximity Video and verified that I am speaking with the correct person using two identifiers.   I discussed the limitations, risks, security and privacy concerns of performing an evaluation and management service by WebEx/MyChart/Doximity Video, including the higher likelihood of inaccurate diagnosis and treatment, and the availability of in person appointments.  We also discussed the likely need of an additional face to face encounter for complete and high quality delivery of care.  I also discussed with the patient that there may be a patient responsible charge related to this service. The patient expressed understanding and wishes to proceed.  Provider location is either at home or medical facility. Patient location is at their home, different from provider location. People involved in care of the patient during this telehealth encounter were myself, my nurse/medical assistant, and my front office/scheduling team member.  Subjective:    CC: Questions about metformin  HPI: This is a pleasant 53 year old male with diabetes, historically well controlled on metformin.  Recently he heard about the recall of metformin and would like to see what we can do.  He tells me he has lost about 20 pounds, he does understand that he needs some routine labs done.  I reviewed the past medical history, family history, social history, surgical history, and allergies today and no changes were needed.  Please see the problem list section below in epic for further details.  Past Medical History: Past Medical History:  Diagnosis Date  . Sleep apnea    cpap 6 yrs  . Vitamin D insufficiency 01/07/2018   Past Surgical History: Past Surgical History:  Procedure Laterality Date  . ELBOW SURGERY Bilateral 01 last   numerous lft , one right  . LUMBAR LAMINECTOMY/DECOMPRESSION MICRODISCECTOMY N/A 05/13/2014   Procedure: LUMBAR LAMINECTOMY/DECOMPRESSION MICRODISCECTOMY;  Surgeon: Emilee HeroMark Leonard Dumonski, MD;  [facility];  Service: Orthopedics;  Laterality: N/A;  Lumbar 3-4 decompression   Social History: Social History   Socioeconomic History  . Marital status: Married    Spouse name: Not on file  . Number of children: Not on file  . Years of education: Not on file  . Highest education level: Not on file  Occupational History  . Not on file  Social Needs  . Financial resource strain: Not on file  . Food insecurity    Worry: Not on file    Inability: Not on file  . Transportation needs    Medical: Not on file    Non-medical: Not on file  Tobacco Use  . Smoking status: Former Smoker    Packs/day: 1.00    Years: 25.00    Pack years: 25.00    Quit date: 05/05/2005    Years since quitting: 14.0  . Smokeless tobacco: Current User  Substance and Sexual Activity  . Alcohol use: No    Alcohol/week: 0.0 standard drinks  . Drug use: No  . Sexual activity: Yes  Lifestyle  . Physical activity    Days per week: Not on file    Minutes per session: Not on file  . Stress: Not on file  Relationships  . Social Musicianconnections    Talks on phone: Not on file    Gets together: Not on file    Attends religious service: Not on file    Active member of club or organization: Not on file    Attends meetings of clubs or organizations: Not on  file    Relationship status: Not on file  Other Topics Concern  . Not on file  Social History Narrative  . Not on file   Family History: Family History  Problem Relation Age of Onset  . Cancer Sister        colon  . Hyperlipidemia Mother   . Hypertension Mother   . Cancer Maternal Uncle   . Cancer Maternal Grandmother   . Sudden death Neg Hx   . Heart attack Neg Hx   . Diabetes Neg Hx    Allergies: No Known Allergies Medications: See med rec.  Review of Systems: No fevers, chills, night sweats, weight loss, chest pain, or shortness of breath.    Objective:    General: Speaking full sentences, no audible heavy breathing.  Sounds alert and appropriately interactive.  Appears well.  Face symmetric.  Extraocular movements intact.  Pupils equal and round.  No nasal flaring or accessory muscle use visualized.  No other physical exam performed due to the non-physical nature of this visit.  Impression and Recommendations:    Controlled type 2 diabetes mellitus without complication, without long-term current use of insulin ([place]) Discontinue metformin, it is on recall. We can recheck his hemoglobin A1c in a couple of months to see if he even needs it, his last A1c was 5.8%. He is up-to-date now on eye exam, he will need some diabetic screening labs.  I discussed the above assessment and treatment plan with the patient. The patient was provided an opportunity to ask questions and all were answered. The patient agreed with the plan and demonstrated an understanding of the instructions.   The patient was advised to call back or seek an in-person evaluation if the symptoms worsen or if the condition fails to improve as anticipated.   I provided 25 minutes of non-face-to-face time during this encounter, 15 minutes of additional time was needed to gather information, review chart, records, communicate/coordinate with staff remotely, troubleshooting the multiple errors that we get every time when trying to do video calls through the electronic medical record, WebEx, and Doximity, restart the encounter multiple times due to instability of the software, as well as complete documentation.   ___________________________________________ Gwen Her. Dianah Field, M.D., ABFM., CAQSM. Primary Care and Sports Medicine [facility]  Adjunct Professor of [employer]

## 2019-05-19 ENCOUNTER — Encounter: Payer: Self-pay | Admitting: Sports Medicine

## 2019-06-13 ENCOUNTER — Emergency Department
Admission: EM | Admit: 2019-06-13 | Discharge: 2019-06-13 | Disposition: A | Discharge status: Home / Self Care | Payer: BC Managed Care – PPO | Source: Home / Self Care

## 2019-06-13 ENCOUNTER — Other Ambulatory Visit: Payer: Self-pay

## 2019-06-13 ENCOUNTER — Encounter: Payer: Self-pay | Admitting: Emergency Medicine

## 2019-06-13 DIAGNOSIS — R358 Other polyuria: Secondary | ICD-10-CM

## 2019-06-13 DIAGNOSIS — R631 Polydipsia: Secondary | ICD-10-CM

## 2019-06-13 DIAGNOSIS — R739 Hyperglycemia, unspecified: Secondary | ICD-10-CM

## 2019-06-13 DIAGNOSIS — R3589 Other polyuria: Secondary | ICD-10-CM

## 2019-06-13 DIAGNOSIS — H538 Other visual disturbances: Secondary | ICD-10-CM | POA: Diagnosis not present

## 2019-06-13 LAB — POCT CBC W AUTO DIFF (K'VILLE URGENT CARE)

## 2019-06-13 LAB — POCT URINALYSIS DIP (MANUAL ENTRY)
Bilirubin, UA: NEGATIVE
Blood, UA: NEGATIVE
Glucose, UA: 500 mg/dL — AB
Leukocytes, UA: NEGATIVE
Nitrite, UA: NEGATIVE
Protein Ur, POC: NEGATIVE mg/dL
Spec Grav, UA: 1.01 (ref 1.010–1.025)
Urobilinogen, UA: 0.2 E.U./dL
pH, UA: 5.5 (ref 5.0–8.0)

## 2019-06-13 LAB — POCT FASTING CBG KUC MANUAL ENTRY: POCT Glucose (KUC): 466 mg/dL — AB (ref 70–99)

## 2019-06-13 MED ORDER — GLIPIZIDE 5 MG PO TABS: 5.0000 mg | ORAL_TABLET | Freq: Two times a day (BID) | ORAL | 0 refills | Status: DC

## 2019-06-13 NOTE — Discharge Instructions (Signed)
°  Please restart your Metformin 1000mg  twice daily as well as start taking the newly prescribed Glipizide 5mg  twice daily before meals.  It is recommended you check your sugar at least 3 times daily. We close this evening at 4PM and are open from 11AM-4PM tomorrow.  You may also check to see if your pharmacy carries blood glucose (sugar) monitors or if they offer free checks at their clinics.    Please follow up with Dr. Dianah Field on Monday or Tuesday of this week.  Call 911 or have someone drive you to the closest hospital if you develop worsening symptoms- abdominal pain, nausea, vomiting, headache, dizziness/lightheadedness, passing out, or other new concerning symptoms develop.

## 2019-06-13 NOTE — ED Provider Notes (Signed)
[facility]    CSN: [id number] Arrival date & time: 06/13/19  0949     History   Chief Complaint Chief Complaint  Patient presents with  . Urinary Tract Infection    HPI RAZIEL KOENIGS is a 53 y.o. male.   HPI  DEZ STAUFFER is a 53 y.o. male presenting to UC with c/o 2 weeks of gradually worsening polydipsia and polyuria with one week of blurry distance vision.  Denies HA, dizziness, n/v/d. Denies fever, chills or sweats. Denies abdominal pain. Hx of Type 2 DM. He was on Metformin 1000mg  BID for a few years before being advised to stop taking about 3 weeks ago due to his sugars being so low.  Pt has since been on vacation at the beach. He has been eating normally and trying to drink as much as usual but he remains thirsty despite increasing water intake.    Past Medical History:  Diagnosis Date  . Sleep apnea    cpap 6 yrs  . Vitamin D insufficiency 01/07/2018    Patient Active Problem List   Diagnosis Date Noted  . Right foot pain 08/18/2018  . Skin rash 08/18/2018  . Vitamin D insufficiency 01/07/2018  . Insomnia 10/11/2017  . Controlled type 2 diabetes mellitus without complication, without long-term current use of insulin ([place]) 09/15/2017  . Left knee injury 09/13/2017  . 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 Right shoulder pain 03/21/2015  . Obstructive apnea 07/09/2014  . Hyperlipidemia with target LDL less than 100 12/18/2012  . Hypogonadism male 12/18/2012  . Annual physical exam 11/03/2012  . Low back pain with spinal stenosis 06/02/2012    Past Surgical History:  Procedure Laterality Date  . ELBOW SURGERY Bilateral 01 last   numerous lft , one right  . LUMBAR LAMINECTOMY/DECOMPRESSION MICRODISCECTOMY N/A 05/13/2014   Procedure: LUMBAR LAMINECTOMY/DECOMPRESSION MICRODISCECTOMY;  Surgeon: Sinclair Ship, MD;  Location: [place];  Service: Orthopedics;  Laterality: N/A;  Lumbar 3-4 decompression       Home Medications     Prior to Admission medications   Medication Sig Start Date End Date Taking? Authorizing Provider  gabapentin (NEURONTIN) 300 MG capsule TAKE 1 CAPSULE BY MOUTH TWICE A DAY AS DIRECTED 05/19/19  Yes [provider]  metFORMIN (GLUCOPHAGE) 1000 MG tablet Take 1,000 mg by mouth 2 (two) times daily with a meal.   Yes [provider]  atorvastatin (LIPITOR) 40 MG tablet Take 1 tablet (40 mg total) by mouth daily. 05/15/19   Silverio Decamp, MD  glipiZIDE (GLUCOTROL) 5 MG tablet Take 1 tablet (5 mg total) by mouth 2 (two) times daily before a meal. 06/13/19 07/13/19  Noe Gens, PA-C    Family History Family History  Problem Relation Age of Onset  . Cancer Sister        colon  . Hyperlipidemia Mother   . Hypertension Mother   . Cancer Maternal Uncle   . Cancer Maternal Grandmother   . Sudden death Neg Hx   . Heart attack Neg Hx   . Diabetes Neg Hx     Social History Social History   Tobacco Use  . Smoking status: Former Smoker    Packs/day: 1.00    Years: 25.00    Pack years: 25.00    Quit date: 05/05/2005    Years since quitting: 14.1  . Smokeless tobacco: Current User  Substance Use Topics  . Alcohol use: No    Alcohol/week: 0.0 standard drinks  .  Drug use: No     Allergies   Patient has no known allergies.   Review of Systems Review of Systems  Constitutional: Negative for appetite change, chills, diaphoresis, fatigue and fever.  Eyes: Positive for visual disturbance. Negative for photophobia, pain and discharge.  Respiratory: Negative for cough, chest tightness and shortness of breath.   Cardiovascular: Negative for chest pain, palpitations and leg swelling.  Gastrointestinal: Negative for abdominal pain, diarrhea, nausea and vomiting.  Endocrine: Positive for polydipsia and polyuria. Negative for polyphagia.  Musculoskeletal: Negative for arthralgias and myalgias.  Skin: Negative for rash.  Neurological: Negative for dizziness, weakness,  light-headedness and headaches.     Physical Exam Triage Vital Signs ED Triage Vitals  Enc Vitals Group     BP 06/13/19 1017 122/83     Pulse Rate 06/13/19 1017 64     Resp --      Temp 06/13/19 1017 98.4 F (36.9 C)     Temp Source 06/13/19 1017 Oral     SpO2 06/13/19 1017 97 %     Weight 06/13/19 1018 268 lb 8 oz (121.8 kg)     Height 06/13/19 1018 5\' 10"  (1.778 m)     Head Circumference --      Peak Flow --      Pain Score 06/13/19 1018 0     Pain Loc --      Pain Edu? --      Excl. in GC? --    No data found.  Updated Vital Signs BP 122/83 (BP Location: Left Arm)   Pulse 64   Temp 98.4 F (36.9 C) (Oral)   Ht 5\' 10"  (1.778 m)   Wt 268 lb 8 oz (121.8 kg)   SpO2 97%   BMI 38.53 kg/m   Visual Acuity Right Eye Distance:   Left Eye Distance:   Bilateral Distance:    Right Eye Near:   Left Eye Near:    Bilateral Near:     Physical Exam Vitals signs and nursing note reviewed.  Constitutional:      General: He is not in acute distress.    Appearance: Normal appearance. He is well-developed. He is not ill-appearing, toxic-appearing or diaphoretic.  HENT:     Head: Normocephalic and atraumatic.     Right Ear: Tympanic membrane normal.     Left Ear: Tympanic membrane normal.     Nose: Nose normal.     Right Sinus: No maxillary sinus tenderness or frontal sinus tenderness.     Left Sinus: No maxillary sinus tenderness or frontal sinus tenderness.     Mouth/Throat:     Lips: Pink.     Mouth: Mucous membranes are moist.     Pharynx: Oropharynx is clear. Uvula midline.  Eyes:     Extraocular Movements: Extraocular movements intact.     Conjunctiva/sclera: Conjunctivae normal.     Pupils: Pupils are equal, round, and reactive to light.  Neck:     Musculoskeletal: Normal range of motion.  Cardiovascular:     Rate and Rhythm: Normal rate and regular rhythm.  Pulmonary:     Effort: Pulmonary effort is normal.     Breath sounds: Normal breath sounds.   Abdominal:     General: There is no distension.     Palpations: Abdomen is soft.     Tenderness: There is no abdominal tenderness. There is no right CVA tenderness or left CVA tenderness.  Musculoskeletal: Normal range of motion.  Skin:    General:  Skin is warm and dry.  Neurological:     Mental Status: He is alert and oriented to person, place, and time.  Psychiatric:        Behavior: Behavior normal.      UC Treatments / Results  Labs (all labs ordered are listed, but only abnormal results are displayed) Labs Reviewed  POCT URINALYSIS DIP (MANUAL ENTRY) - Abnormal; Notable for the following components:      Result Value   Glucose, UA =500 (*)    Ketones, POC UA small (15) (*)    All other components within normal limits  POCT FASTING CBG [facility] MANUAL ENTRY - Abnormal; Notable for the following components:   POCT Glucose ([facility]) 466 (*)    All other components within normal limits  HEMOGLOBIN A1C  POCT CBC W AUTO DIFF ([facility])    EKG   Radiology No results found.  Procedures Procedures (including critical care time)  Medications Ordered in UC Medications - No data to display  Initial Impression / Assessment and Plan / UC Course  I have reviewed the triage vital signs and the nursing notes.  Pertinent labs & imaging results that were available during my care of the patient were reviewed by me and considered in my medical decision making (see chart for details).     Pt appears well, NAD.  Vitals: WNL CBG: 466 Consulted with pt's PCP, Dr. Benjamin Stainhekkekandam, who recommended having pt restart his metformin 1000mg  twice daily as well as start pt on glipizide 5mg  twice daily. Encouraged pt to check his sugar 3 times daily, however, pt does not have monitor at home. [facility] and/or check with the pharmacy to see if they check CBG or offer home glucose monitors. Discussed symptoms that warrant  emergent care in the ED. AVS provided   Final Clinical Impressions(s) / UC Diagnoses   Final diagnoses:  Hyperglycemia  Polydipsia  Polyuria  Blurred vision, bilateral     Discharge Instructions      Please restart your Metformin 1000mg  twice daily as well as start taking the newly prescribed Glipizide 5mg  twice daily before meals.  It is recommended you check your sugar at least 3 times daily. We close this evening at 4PM and are open from 11AM-4PM tomorrow.  You may also check to see if your pharmacy carries blood glucose (sugar) monitors or if they offer free checks at their clinics.    Please follow up with Dr. Benjamin Stainhekkekandam on Monday or Tuesday of this week.  Call 911 or have someone drive you to the closest hospital if you develop worsening symptoms- abdominal pain, nausea, vomiting, headache, dizziness/lightheadedness, passing out, or other new concerning symptoms develop.      ED Prescriptions    Medication Sig Dispense Auth. Provider   glipiZIDE (GLUCOTROL) 5 MG tablet Take 1 tablet (5 mg total) by mouth 2 (two) times daily before a meal. 60 tablet Lurene ShadowPhelps, Esmerelda Finnigan O, PA-C     Controlled Substance Prescriptions [place] Controlled Substance Registry consulted? Not Applicable   Rolla Platehelps, Miosha Behe O, PA-C 06/14/19 1107

## 2019-06-13 NOTE — ED Triage Notes (Signed)
Patient c/o of being thirsty, urgency, frequency, no dysuria, visual changes since stopping Metformin.

## 2019-06-14 ENCOUNTER — Emergency Department
Admission: EM | Admit: 2019-06-14 | Discharge: 2019-06-14 | Disposition: A | Discharge status: Home / Self Care | Payer: BC Managed Care – PPO | Source: Home / Self Care

## 2019-06-14 ENCOUNTER — Other Ambulatory Visit: Payer: Self-pay

## 2019-06-14 ENCOUNTER — Telehealth: Payer: Self-pay | Admitting: Emergency Medicine

## 2019-06-14 LAB — POCT FASTING CBG KUC MANUAL ENTRY: POCT Glucose (KUC): 255 mg/dL — AB (ref 70–99)

## 2019-06-14 NOTE — Telephone Encounter (Signed)
Opened to add POCT CBG order

## 2019-06-14 NOTE — Telephone Encounter (Signed)
Spoke with [name] this morning. Pt states his family was concerned about his sugar being 466 at [facility] yesterday so they encouraged him to go to the hospital. His CBG there was 351.  He was given "2 bags of fluids" and checked all the labs, everything was good so they sent him home.  His SIL was able to check his sugar last night and it was 398 but pt states he had just eaten supper. He plans to come by this afternoon around 3:45PM for [place] to check his sugar again. He did restart his metformin and is taking the newly prescribed glipizide as well.

## 2019-06-14 NOTE — ED Notes (Signed)
Patient states no difference in his symptoms today; he is not driving; having visual problems. Last evening at 1800 BG was 390. Today he ate lunch at 13:15. His BG is no 255. He will follow with Dr.T early this week and will do home BG.

## 2019-06-14 NOTE — ED Triage Notes (Signed)
Patient here for finger stick blood glucose as follow up from yesterday's visit per E.Gerarda Fraction.

## 2019-06-15 ENCOUNTER — Telehealth: Payer: Self-pay | Admitting: Sports Medicine

## 2019-06-15 LAB — HEMOGLOBIN A1C
Hgb A1c MFr Bld: 11.9 % of total Hgb — ABNORMAL HIGH (ref <5.7)
Mean Plasma Glucose: 295 (calc)
eAG (mmol/L): 16.3 (calc)

## 2019-06-15 NOTE — Telephone Encounter (Signed)
Patient was seen in UC this past weekend and A1C was 11.9. Dr. Darene Lamer has looked at the results and spoke with [facility]. Per Dr. Darene Lamer the patient will need to take his medication.

## 2019-06-16 ENCOUNTER — Encounter: Payer: Self-pay | Admitting: Sports Medicine

## 2019-06-16 ENCOUNTER — Other Ambulatory Visit: Payer: Self-pay

## 2019-06-16 ENCOUNTER — Ambulatory Visit (INDEPENDENT_AMBULATORY_CARE_PROVIDER_SITE_OTHER): Payer: BC Managed Care – PPO | Admitting: Sports Medicine

## 2019-06-16 VITALS — BP 143/84 | HR 67 | Ht 70.0 in | Wt 270.0 lb

## 2019-06-16 DIAGNOSIS — E1165 Type 2 diabetes mellitus with hyperglycemia: Secondary | ICD-10-CM | POA: Diagnosis not present

## 2019-06-16 DIAGNOSIS — E119 Type 2 diabetes mellitus without complications: Secondary | ICD-10-CM | POA: Diagnosis not present

## 2019-06-16 LAB — GLUCOSE, POCT (MANUAL RESULT ENTRY): POC Glucose: 230 mg/dl — AB (ref 70–99)

## 2019-06-16 LAB — HM DIABETES EYE EXAM

## 2019-06-16 MED ORDER — AMBULATORY NON FORMULARY MEDICATION: 0 refills | Status: DC

## 2019-06-16 NOTE — Progress Notes (Signed)
Subjective:    CC: Follow-up  HPI: [name] has diabetes mellitus type 2, historically well controlled with metformin, there was a recall on metformin and it was stopped, unfortunately he started to develop some blurry vision, polyuria, polydipsia, he was seen in urgent care where his blood sugar was very high, the urgent care provider called me and I recommended restarting metformin 1000 twice a day and glipizide 5 twice a day, ultimately he ended up in the ED, he was advised to follow through with the regimen of metformin and glipizide.  He returns today with a slight improvement in his vision, but still with some blurriness, he does have an optometrist visit today.  Blood sugars are down to 230.  I reviewed the past medical history, family history, social history, surgical history, and allergies today and no changes were needed.  Please see the problem list section below in epic for further details.  Past Medical History: Past Medical History:  Diagnosis Date  . Sleep apnea    cpap 6 yrs  . Vitamin D insufficiency 01/07/2018   Past Surgical History: Past Surgical History:  Procedure Laterality Date  . ELBOW SURGERY Bilateral 01 last   numerous lft , one right  . LUMBAR LAMINECTOMY/DECOMPRESSION MICRODISCECTOMY N/A 05/13/2014   Procedure: LUMBAR LAMINECTOMY/DECOMPRESSION MICRODISCECTOMY;  Surgeon: Sinclair Ship, MD;  Location: [place];  Service: Orthopedics;  Laterality: N/A;  Lumbar 3-4 decompression   Social History: Social History   Socioeconomic History  . Marital status: Married    Spouse name: Not on file  . Number of children: Not on file  . Years of education: Not on file  . Highest education level: Not on file  Occupational History  . Not on file  Social Needs  . Financial resource strain: Not on file  . Food insecurity    Worry: Not on file    Inability: Not on file  . Transportation needs    Medical: Not on file    Non-medical: Not on file  Tobacco Use  .  Smoking status: Former Smoker    Packs/day: 1.00    Years: 25.00    Pack years: 25.00    Quit date: 05/05/2005    Years since quitting: 14.1  . Smokeless tobacco: Current User  Substance and Sexual Activity  . Alcohol use: No    Alcohol/week: 0.0 standard drinks  . Drug use: No  . Sexual activity: Yes  Lifestyle  . Physical activity    Days per week: Not on file    Minutes per session: Not on file  . Stress: Not on file  Relationships  . Social Herbalist on phone: Not on file    Gets together: Not on file    Attends religious service: Not on file    Active member of club or organization: Not on file    Attends meetings of clubs or organizations: Not on file    Relationship status: Not on file  Other Topics Concern  . Not on file  Social History Narrative  . Not on file   Family History: Family History  Problem Relation Age of Onset  . Cancer Sister        colon  . Hyperlipidemia Mother   . Hypertension Mother   . Cancer Maternal Uncle   . Cancer Maternal Grandmother   . Sudden death Neg Hx   . Heart attack Neg Hx   . Diabetes Neg Hx    Allergies: No Known Allergies  Medications: See med rec.  Review of Systems: No fevers, chills, night sweats, weight loss, chest pain, or shortness of breath.   Objective:    General: Well Developed, well nourished, and in no acute distress.  Neuro: Alert and oriented x3, extra-ocular muscles intact, sensation grossly intact.  HEENT: Normocephalic, atraumatic, pupils equal round reactive to light, neck supple, no masses, no lymphadenopathy, thyroid nonpalpable.  Skin: Warm and dry, no rashes. Cardiac: Regular rate and rhythm, no murmurs rubs or gallops, no lower extremity edema.  Respiratory: Clear to auscultation bilaterally. Not using accessory muscles, speaking in full sentences.  Impression and Recommendations:    Controlled type 2 diabetes mellitus without complication, without long-term current use of insulin  (HCC) Patient has restarted metformin, glipizide 5 twice daily. Blood sugar is down to 230 today, vision slowly improving. He does have an eye exam later today. Glucometer prescription given, he will check his blood sugars in the morning and 3 times a day, every week he will give me a list of all of his sugars. Out of work for 3 months while we get the blood sugar under control, he will need short-term disability and will return to do the paperwork. He became emotional at the end of the visit, I talked him down, and we discussed that we would treat this aggressively.  I spent 25 minutes with this patient, greater than 50% was face-to-face time counseling regarding the above diagnoses.  ___________________________________________ Ihor Austinhomas J. Benjamin Stainhekkekandam, M.D., ABFM., CAQSM. Primary Care and Sports Medicine [facility]  Adjunct Professor of Family Medicine  [employer]

## 2019-06-16 NOTE — Assessment & Plan Note (Signed)
Patient has restarted metformin, glipizide 5 twice daily. Blood sugar is down to 230 today, vision slowly improving. He does have an eye exam later today. Glucometer prescription given, he will check his blood sugars in the morning and 3 times a day, every week he will give me a list of all of his sugars. Out of work for 3 months while we get the blood sugar under control, he will need short-term disability and will return to do the paperwork. He became emotional at the end of the visit, I talked him down, and we discussed that we would treat this aggressively.

## 2019-06-18 ENCOUNTER — Encounter: Payer: Self-pay | Admitting: Sports Medicine

## 2019-06-18 ENCOUNTER — Ambulatory Visit (INDEPENDENT_AMBULATORY_CARE_PROVIDER_SITE_OTHER): Payer: BC Managed Care – PPO | Admitting: Sports Medicine

## 2019-06-18 ENCOUNTER — Other Ambulatory Visit: Payer: Self-pay

## 2019-06-18 VITALS — BP 119/77 | HR 69 | Ht 70.0 in | Wt 273.0 lb

## 2019-06-18 DIAGNOSIS — E1165 Type 2 diabetes mellitus with hyperglycemia: Secondary | ICD-10-CM

## 2019-06-18 DIAGNOSIS — E119 Type 2 diabetes mellitus without complications: Secondary | ICD-10-CM

## 2019-06-18 LAB — GLUCOSE, POCT (MANUAL RESULT ENTRY): POC Glucose: 238 mg/dl — AB (ref 70–99)

## 2019-06-18 MED ORDER — GLIPIZIDE 10 MG PO TABS: 10.0000 mg | ORAL_TABLET | Freq: Two times a day (BID) | ORAL | 3 refills | Status: DC

## 2019-06-18 NOTE — Assessment & Plan Note (Signed)
Continue metformin 1000 twice daily, increasing glipizide to 10 mg twice daily. Blood sugars have been ranging from 190-230. He did see his optometrist 2 days ago, eyes look good, maybe a bit of edema in the lens as expected with a temporary period of hyperglycemia. Continue to check his blood sugars 3 times daily, he will email me weekly all of his blood sugars. Return to see me in 1 month. Disability paperwork filled out today.

## 2019-06-18 NOTE — Progress Notes (Signed)
Subjective:    CC: Diabetes follow-up  HPI: Bobby Saunders returns, he has been checking his blood sugars and they have run between 190 and 230.  His vision is pretty stable, he had his optometry visit which was uneventful.  No problems with medications, no episodes of hypoglycemia.  He is here mostly for his disability paperwork.  I reviewed the past medical history, family history, social history, surgical history, and allergies today and no changes were needed.  Please see the problem list section below in epic for further details.  Past Medical History: Past Medical History:  Diagnosis Date  . Sleep apnea    cpap 6 yrs  . Vitamin D insufficiency 01/07/2018   Past Surgical History: Past Surgical History:  Procedure Laterality Date  . ELBOW SURGERY Bilateral 01 last   numerous lft , one right  . LUMBAR LAMINECTOMY/DECOMPRESSION MICRODISCECTOMY N/A 05/13/2014   Procedure: LUMBAR LAMINECTOMY/DECOMPRESSION MICRODISCECTOMY;  Surgeon: Sinclair Ship, MD;  Location: [place];  Service: Orthopedics;  Laterality: N/A;  Lumbar 3-4 decompression   Social History: Social History   Socioeconomic History  . Marital status: Married    Spouse name: Not on file  . Number of children: Not on file  . Years of education: Not on file  . Highest education level: Not on file  Occupational History  . Not on file  Social Needs  . Financial resource strain: Not on file  . Food insecurity    Worry: Not on file    Inability: Not on file  . Transportation needs    Medical: Not on file    Non-medical: Not on file  Tobacco Use  . Smoking status: Former Smoker    Packs/day: 1.00    Years: 25.00    Pack years: 25.00    Quit date: 05/05/2005    Years since quitting: 14.1  . Smokeless tobacco: Current User  Substance and Sexual Activity  . Alcohol use: No    Alcohol/week: 0.0 standard drinks  . Drug use: No  . Sexual activity: Yes  Lifestyle  . Physical activity    Days per week: Not on file   Minutes per session: Not on file  . Stress: Not on file  Relationships  . Social Herbalist on phone: Not on file    Gets together: Not on file    Attends religious service: Not on file    Active member of club or organization: Not on file    Attends meetings of clubs or organizations: Not on file    Relationship status: Not on file  Other Topics Concern  . Not on file  Social History Narrative  . Not on file   Family History: Family History  Problem Relation Age of Onset  . Cancer Sister        colon  . Hyperlipidemia Mother   . Hypertension Mother   . Cancer Maternal Uncle   . Cancer Maternal Grandmother   . Sudden death Neg Hx   . Heart attack Neg Hx   . Diabetes Neg Hx    Allergies: No Known Allergies Medications: See med rec.  Review of Systems: No fevers, chills, night sweats, weight loss, chest pain, or shortness of breath.   Objective:    General: Well Developed, well nourished, and in no acute distress.  Neuro: Alert and oriented x3, extra-ocular muscles intact, sensation grossly intact.  HEENT: Normocephalic, atraumatic, pupils equal round reactive to light, neck supple, no masses, no lymphadenopathy, thyroid nonpalpable.  Skin: Warm and dry, no rashes. Cardiac: Regular rate and rhythm, no murmurs rubs or gallops, no lower extremity edema.  Respiratory: Clear to auscultation bilaterally. Not using accessory muscles, speaking in full sentences.  Impression and Recommendations:    Controlled type 2 diabetes mellitus without complication, without long-term current use of insulin (HCC) Continue metformin 1000 twice daily, increasing glipizide to 10 mg twice daily. Blood sugars have been ranging from 190-230. He did see his optometrist 2 days ago, eyes look good, maybe a bit of edema in the lens as expected with a temporary period of hyperglycemia. Continue to check his blood sugars 3 times daily, he will email me weekly all of his blood sugars. Return  to see me in 1 month. Disability paperwork filled out today.   ___________________________________________ Ihor Austinhomas J. Benjamin Stainhekkekandam, M.D., ABFM., CAQSM. Primary Care and Sports Medicine [facility]  Adjunct Professor of Family Medicine  [employer]

## 2019-06-19 ENCOUNTER — Encounter: Payer: Self-pay | Admitting: Sports Medicine

## 2019-06-25 ENCOUNTER — Encounter: Payer: Self-pay | Admitting: Sports Medicine

## 2019-06-27 ENCOUNTER — Encounter: Payer: Self-pay | Admitting: Sports Medicine

## 2019-07-04 ENCOUNTER — Encounter: Payer: Self-pay | Admitting: Sports Medicine

## 2019-07-16 ENCOUNTER — Encounter: Payer: Self-pay | Admitting: Sports Medicine

## 2019-07-16 ENCOUNTER — Other Ambulatory Visit: Payer: Self-pay

## 2019-07-16 ENCOUNTER — Ambulatory Visit (INDEPENDENT_AMBULATORY_CARE_PROVIDER_SITE_OTHER): Payer: BC Managed Care – PPO | Admitting: Sports Medicine

## 2019-07-16 DIAGNOSIS — E119 Type 2 diabetes mellitus without complications: Secondary | ICD-10-CM

## 2019-07-16 NOTE — Progress Notes (Signed)
Subjective:    CC: Follow-up  HPI: Bobby Saunders is a pleasant 53 year old male here for follow-up of his diabetes, it came on fairly quickly, and he developed corneal and lens edema very quickly.  We treated him aggressively with high-dose metformin, glipizide, and his vision is now approximately 99% better, blood sugars typically run between 70 and 130, typically in the 120s in the mornings fasting.  He feels significantly better.  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  I reviewed the past medical history, family history, social history, surgical history, and allergies today and no changes were needed.  Please see the problem list section below in epic for further details.  Past Medical History: Past Medical History:  Diagnosis Date  . Sleep apnea    cpap 6 yrs  . Vitamin D insufficiency 01/07/2018   Past Surgical History: Past Surgical History:  Procedure Laterality Date  . ELBOW SURGERY Bilateral 01 last   numerous lft , one right  . LUMBAR LAMINECTOMY/DECOMPRESSION MICRODISCECTOMY N/A 05/13/2014   Procedure: LUMBAR LAMINECTOMY/DECOMPRESSION MICRODISCECTOMY;  Surgeon: Sinclair Ship, MD;  Location: [place];  Service: Orthopedics;  Laterality: N/A;  Lumbar 3-4 decompression   Social History: Social History   Socioeconomic History  . Marital status: Married    Spouse name: Not on file  . Number of children: Not on file  . Years of education: Not on file  . Highest education level: Not on file  Occupational History  . Not on file  Social Needs  . Financial resource strain: Not on file  . Food insecurity    Worry: Not on file    Inability: Not on file  . Transportation needs    Medical: Not on file    Non-medical: Not on file  Tobacco Use  . Smoking status: Former Smoker    Packs/day: 1.00    Years: 25.00    Pack years: 25.00    Quit date: 05/05/2005    Years since quitting: 14.2  . Smokeless tobacco:  Current User  Substance and Sexual Activity  . Alcohol use: No    Alcohol/week: 0.0 standard drinks  . Drug use: No  . Sexual activity: Yes  Lifestyle  . Physical activity    Days per week: Not on file    Minutes per session: Not on file  . Stress: Not on file  Relationships  . Social Herbalist on phone: Not on file    Gets together: Not on file    Attends religious service: Not on file    Active member of club or organization: Not on file    Attends meetings of clubs or organizations: Not on file    Relationship status: Not on file  Other Topics Concern  . Not on file  Social History Narrative  . Not on file   Family History: Family History  Problem Relation Age of Onset  . Cancer Sister        colon  . Hyperlipidemia Mother   . Hypertension Mother   . Cancer Maternal Uncle   . Cancer Maternal Grandmother   . Sudden death Neg Hx   . Heart attack Neg Hx   . Diabetes Neg Hx    Allergies: No Known Allergies Medications: See med rec.  Review of Systems: No fevers, chills, night sweats, weight loss, chest pain, or shortness of breath.   Objective:    General: Well Developed, well nourished, and  in no acute distress.  Neuro: Alert and oriented x3, extra-ocular muscles intact, sensation grossly intact.  HEENT: Normocephalic, atraumatic, pupils equal round reactive to light, neck supple, no masses, no lymphadenopathy, thyroid nonpalpable.  Skin: Warm and dry, no rashes. Cardiac: Regular rate and rhythm, no murmurs rubs or gallops, no lower extremity edema.  Respiratory: Clear to auscultation bilaterally. Not using accessory muscles, speaking in full sentences.  Impression and Recommendations:    Controlled type 2 diabetes mellitus without complication, without long-term current use of insulin (HCC) Diabetes continues to improve, currently on metformin 1000 twice daily and glipizide 10 mg twice daily. Blood sugars typically range from 70-130. Vision is 99%  better. Return in 2 months for repeat hemoglobin A1c.   ___________________________________________ Ihor Austinhomas J. Benjamin Stainhekkekandam, M.D., ABFM., CAQSM. Primary Care and Sports Medicine [facility]  Adjunct Professor of Family Medicine  [employer]

## 2019-07-16 NOTE — Assessment & Plan Note (Signed)
Diabetes continues to improve, currently on metformin 1000 twice daily and glipizide 10 mg twice daily. Blood sugars typically range from 70-130. Vision is 99% better. Return in 2 months for repeat hemoglobin A1c.

## 2019-08-04 LAB — HM COLONOSCOPY

## 2019-08-21 ENCOUNTER — Encounter: Payer: Self-pay | Admitting: Sports Medicine

## 2019-08-27 ENCOUNTER — Encounter: Payer: Self-pay | Admitting: Sports Medicine

## 2019-08-27 ENCOUNTER — Ambulatory Visit: Payer: BC Managed Care – PPO | Admitting: Sports Medicine

## 2019-08-27 ENCOUNTER — Other Ambulatory Visit: Payer: Self-pay

## 2019-08-27 VITALS — BP 115/65 | HR 73 | Ht 70.0 in | Wt 279.0 lb

## 2019-08-27 DIAGNOSIS — E119 Type 2 diabetes mellitus without complications: Secondary | ICD-10-CM | POA: Diagnosis not present

## 2019-08-27 DIAGNOSIS — Z23 Encounter for immunization: Secondary | ICD-10-CM

## 2019-08-27 DIAGNOSIS — Z0000 Encounter for general adult medical examination without abnormal findings: Secondary | ICD-10-CM | POA: Diagnosis not present

## 2019-08-27 DIAGNOSIS — L989 Disorder of the skin and subcutaneous tissue, unspecified: Secondary | ICD-10-CM

## 2019-08-27 LAB — POCT GLYCOSYLATED HEMOGLOBIN (HGB A1C): Hemoglobin A1C: 6.5 % — AB (ref 4.0–5.6)

## 2019-08-27 MED ORDER — OZEMPIC (1 MG/DOSE) 2 MG/1.5ML ~~LOC~~ SOPN: 1.0000 mg | PEN_INJECTOR | SUBCUTANEOUS | 11 refills | Status: DC

## 2019-08-27 MED ORDER — OZEMPIC (0.25 OR 0.5 MG/DOSE) 2 MG/1.5ML ~~LOC~~ SOPN: 1.0000 | PEN_INJECTOR | SUBCUTANEOUS | 1 refills | Status: DC

## 2019-08-27 NOTE — Assessment & Plan Note (Signed)
Flu and Shingrix today. Return in 3 months for repeat A1c, we can do his second Shingrix at that time.

## 2019-08-27 NOTE — Assessment & Plan Note (Signed)
A1c is now well controlled at 6.5%. We are going to switch from glipizide 10 twice daily to Ozempic.

## 2019-08-27 NOTE — Assessment & Plan Note (Signed)
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 Return for shave biopsy.

## 2019-08-27 NOTE — Progress Notes (Signed)
Subjective:    CC: Follow-up  HPI: Bobby Saunders returns, he is a pleasant 53 year old male with diabetes, he is doing extremely well, he would like to switch to a GLP-1 for the added weight loss benefit.  I reviewed the past medical history, family history, social history, surgical history, and allergies today and no changes were needed.  Please see the problem list section below in epic for further details.  Past Medical History: Past Medical History:  Diagnosis Date  . Sleep apnea    cpap 6 yrs  . Vitamin D insufficiency 01/07/2018   Past Surgical History: Past Surgical History:  Procedure Laterality Date  . ELBOW SURGERY Bilateral 01 last   numerous lft , one right  . LUMBAR LAMINECTOMY/DECOMPRESSION MICRODISCECTOMY N/A 05/13/2014   Procedure: LUMBAR LAMINECTOMY/DECOMPRESSION MICRODISCECTOMY;  Surgeon: Emilee Hero, MD;  Location: [facility] OR;  Service: Orthopedics;  Laterality: N/A;  Lumbar 3-4 decompression   Social History: Social History   Socioeconomic History  . Marital status: Married    Spouse name: Not on file  . Number of children: Not on file  . Years of education: Not on file  . Highest education level: Not on file  Occupational History  . Not on file  Social Needs  . Financial resource strain: Not on file  . Food insecurity    Worry: Not on file    Inability: Not on file  . Transportation needs    Medical: Not on file    Non-medical: Not on file  Tobacco Use  . Smoking status: Former Smoker    Packs/day: 1.00    Years: 25.00    Pack years: 25.00    Quit date: 05/05/2005    Years since quitting: 14.3  . Smokeless tobacco: Current User  Substance and Sexual Activity  . Alcohol use: No    Alcohol/week: 0.0 standard drinks  . Drug use: No  . Sexual activity: Yes  Lifestyle  . Physical activity    Days per week: Not on file    Minutes per session: Not on file  . Stress: Not on file  Relationships  . Social Musician on phone: Not on file     Gets together: Not on file    Attends religious service: Not on file    Active member of club or organization: Not on file    Attends meetings of clubs or organizations: Not on file    Relationship status: Not on file  Other Topics Concern  . Not on file  Social History Narrative  . Not on file   Family History: Family History  Problem Relation Age of Onset  . Cancer Sister        colon  . Hyperlipidemia Mother   . Hypertension Mother   . Cancer Maternal Uncle   . Cancer Maternal Grandmother   . Sudden death Neg Hx   . Heart attack Neg Hx   . Diabetes Neg Hx    Allergies: No Known Allergies Medications: See med rec.  Review of Systems: No fevers, chills, night sweats, weight loss, chest pain, or shortness of breath.   Objective:    General: Well Developed, well nourished, and in no acute distress.  Neuro: Alert and oriented x3, extra-ocular muscles intact, sensation grossly intact.  HEENT: Normocephalic, atraumatic, pupils equal round reactive to light, neck supple, no masses, no lymphadenopathy, thyroid nonpalpable.  Skin: Warm and dry, scaly 1 cm lesion on his scalp. Cardiac: Regular rate and rhythm, no murmurs  rubs or gallops, no lower extremity edema.  Respiratory: Clear to auscultation bilaterally. Not using accessory muscles, speaking in full sentences.  Impression and Recommendations:    Controlled type 2 diabetes mellitus without complication, without long-term current use of insulin (HCC) A1c is now well controlled at 6.5%. We are going to switch from glipizide 10 twice daily to Ozempic.   Annual physical exam Flu and Shingrix today. Return in 3 months for repeat A1c, we can do his second Shingrix at that time.  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 Return for shave biopsy.  I spent 25 minutes with this patient, greater than 50% was face-to-face time counseling regarding the above diagnoses.   ___________________________________________ Gwen Her. Dianah Field, M.D., ABFM., CAQSM. Primary Care and Sports Medicine [facility]  Adjunct Professor of [place] [employer]

## 2019-08-28 ENCOUNTER — Encounter: Payer: Self-pay | Admitting: Sports Medicine

## 2019-08-28 ENCOUNTER — Telehealth: Payer: Self-pay | Admitting: Sports Medicine

## 2019-08-28 NOTE — Telephone Encounter (Signed)
Pt was seen yesterday 08/27/19  By Dr.T and brought his paperwork back in Yesterday so He can have it filled out and a return work note for This coming Monday 08/31/19. Please call pt when this is completed so he can pick up and return to work Monday 08/31/19. Thanks

## 2019-08-28 NOTE — Telephone Encounter (Addendum)
Paperwork is complete.  In my box but I went ahead and faxed the completed form and return to work note to the fax number noted on the top of the form.

## 2019-08-31 NOTE — Telephone Encounter (Signed)
Pt picked forms up this morning.

## 2019-09-07 ENCOUNTER — Ambulatory Visit: Payer: BC Managed Care – PPO | Admitting: Sports Medicine

## 2019-09-07 ENCOUNTER — Other Ambulatory Visit: Payer: Self-pay | Admitting: *Deleted

## 2019-09-07 ENCOUNTER — Encounter: Payer: Self-pay | Admitting: Sports Medicine

## 2019-09-07 DIAGNOSIS — E119 Type 2 diabetes mellitus without complications: Secondary | ICD-10-CM

## 2019-09-08 MED ORDER — ACCU-CHEK GUIDE VI STRP: ORAL_STRIP | 3 refills | Status: DC

## 2019-09-13 ENCOUNTER — Encounter: Payer: Self-pay | Admitting: Sports Medicine

## 2019-11-02 ENCOUNTER — Ambulatory Visit: Payer: BC Managed Care – PPO | Admitting: Sports Medicine

## 2019-11-05 ENCOUNTER — Encounter: Payer: Self-pay | Admitting: Sports Medicine

## 2019-11-05 ENCOUNTER — Ambulatory Visit (INDEPENDENT_AMBULATORY_CARE_PROVIDER_SITE_OTHER): Payer: BC Managed Care – PPO | Admitting: Sports Medicine

## 2019-11-05 ENCOUNTER — Other Ambulatory Visit: Payer: Self-pay

## 2019-11-05 ENCOUNTER — Ambulatory Visit (INDEPENDENT_AMBULATORY_CARE_PROVIDER_SITE_OTHER): Payer: BC Managed Care – PPO

## 2019-11-05 DIAGNOSIS — G8929 Other chronic pain: Secondary | ICD-10-CM

## 2019-11-05 DIAGNOSIS — M25532 Pain in left wrist: Secondary | ICD-10-CM | POA: Diagnosis not present

## 2019-11-05 NOTE — Assessment & Plan Note (Signed)
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 He has done some stretching, activity modification and has had no improvement for greater than 6 weeks, x-rays today, Velcro wrist brace, return on Monday or Tuesday for MR arthrogram of the wrist.

## 2019-11-05 NOTE — Progress Notes (Signed)
Subjective:    CC: Left wrist pain  HPI: Since May this pleasant 53 year old male has had pain in his left wrist, he recalls twisting his wrist, feeling a pop on the ulnar aspect and having pain and swelling since then.  He has tried activity modification, stretches without any improvement, pain is moderate, persistent, localized with radiation.  I reviewed the past medical history, family history, social history, surgical history, and allergies today and no changes were needed.  Please see the problem list section below in epic for further details.  Past Medical History: Past Medical History:  Diagnosis Date  . Sleep apnea    cpap 6 yrs  . Vitamin D insufficiency 01/07/2018   Past Surgical History: Past Surgical History:  Procedure Laterality Date  . ELBOW SURGERY Bilateral 01 last   numerous lft , one right  . LUMBAR LAMINECTOMY/DECOMPRESSION MICRODISCECTOMY N/A 05/13/2014   Procedure: LUMBAR LAMINECTOMY/DECOMPRESSION MICRODISCECTOMY;  Surgeon: Emilee Hero, MD;  Location: [facility] OR;  Service: Orthopedics;  Laterality: N/A;  Lumbar 3-4 decompression   Social History: Social History   Socioeconomic History  . Marital status: Married    Spouse name: Not on file  . Number of children: Not on file  . Years of education: Not on file  . Highest education level: Not on file  Occupational History  . Not on file  Social Needs  . Financial resource strain: Not on file  . Food insecurity    Worry: Not on file    Inability: Not on file  . Transportation needs    Medical: Not on file    Non-medical: Not on file  Tobacco Use  . Smoking status: Former Smoker    Packs/day: 1.00    Years: 25.00    Pack years: 25.00    Quit date: 05/05/2005    Years since quitting: 14.5  . Smokeless tobacco: Current User  Substance and Sexual Activity  . Alcohol use: No    Alcohol/week: 0.0 standard drinks  . Drug use: No  . Sexual activity: Yes  Lifestyle  . Physical activity    Days per  week: Not on file    Minutes per session: Not on file  . Stress: Not on file  Relationships  . Social Musician on phone: Not on file    Gets together: Not on file    Attends religious service: Not on file    Active member of club or organization: Not on file    Attends meetings of clubs or organizations: Not on file    Relationship status: Not on file  Other Topics Concern  . Not on file  Social History Narrative  . Not on file   Family History: Family History  Problem Relation Age of Onset  . Cancer Sister        colon  . Hyperlipidemia Mother   . Hypertension Mother   . Cancer Maternal Uncle   . Cancer Maternal Grandmother   . Sudden death Neg Hx   . Heart attack Neg Hx   . Diabetes Neg Hx    Allergies: No Known Allergies Medications: See med rec.  Review of Systems: No fevers, chills, night sweats, weight loss, chest pain, or shortness of breath.   Objective:    General: Well Developed, well nourished, and in no acute distress.  Neuro: Alert and oriented x3, extra-ocular muscles intact, sensation grossly intact.  HEENT: Normocephalic, atraumatic, pupils equal round reactive to light, neck supple, no masses, no  lymphadenopathy, thyroid nonpalpable.  Skin: Warm and dry, no rashes. Cardiac: Regular rate and rhythm, no murmurs rubs or gallops, no lower extremity edema.  Respiratory: Clear to auscultation bilaterally. Not using accessory muscles, speaking in full sentences. Left wrist: Swollen over the sixth extensor compartment ROM smooth and normal with good flexion and extension and ulnar/radial deviation that is symmetrical with opposite wrist. Pain with rotation of the wrist with the radius and ulna held stable Palpation is normal over metacarpals, navicular, lunate, and TFCC; tendons without tenderness/ swelling No snuffbox tenderness. No tenderness over Canal of [place]. Strength 5/5 in all directions without pain. Negative tinel's and phalens signs.  Negative Finkelstein sign. Negative Watson's test.  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 He has done some stretching, activity modification and has had no improvement for greater than 6 weeks, x-rays today, Velcro wrist brace, return on Monday or Tuesday for MR arthrogram of the wrist.   ___________________________________________ Gwen Her. Dianah Field, M.D., ABFM., CAQSM. Primary Care and Sports Medicine [place] [facility]  Adjunct Professor of [place] [employer]

## 2019-11-10 ENCOUNTER — Ambulatory Visit: Payer: BC Managed Care – PPO | Admitting: Sports Medicine

## 2019-11-24 ENCOUNTER — Telehealth: Payer: Self-pay | Admitting: Sports Medicine

## 2019-11-24 NOTE — Telephone Encounter (Signed)
Appointment has been made and rescheduled.

## 2019-11-30 ENCOUNTER — Telehealth: Payer: Self-pay | Admitting: Neurology

## 2019-11-30 NOTE — Telephone Encounter (Signed)
[place] with [facility] called stating she needs patient's MR order faxed to her at [phone]. Order signed and faxed with confirmation received.

## 2019-12-01 ENCOUNTER — Ambulatory Visit: Payer: BC Managed Care – PPO | Admitting: Sports Medicine

## 2019-12-16 ENCOUNTER — Ambulatory Visit (INDEPENDENT_AMBULATORY_CARE_PROVIDER_SITE_OTHER): Payer: BC Managed Care – PPO | Admitting: Sports Medicine

## 2019-12-16 ENCOUNTER — Encounter: Payer: Self-pay | Admitting: Sports Medicine

## 2019-12-16 ENCOUNTER — Other Ambulatory Visit: Payer: Self-pay

## 2019-12-16 DIAGNOSIS — M25532 Pain in left wrist: Secondary | ICD-10-CM | POA: Diagnosis not present

## 2019-12-16 DIAGNOSIS — E119 Type 2 diabetes mellitus without complications: Secondary | ICD-10-CM | POA: Diagnosis not present

## 2019-12-16 DIAGNOSIS — G8929 Other chronic pain: Secondary | ICD-10-CM

## 2019-12-16 DIAGNOSIS — E785 Hyperlipidemia, unspecified: Secondary | ICD-10-CM | POA: Diagnosis not present

## 2019-12-16 LAB — POCT GLYCOSYLATED HEMOGLOBIN (HGB A1C): Hemoglobin A1C: 5.6 % (ref 4.0–5.6)

## 2019-12-16 NOTE — Addendum Note (Signed)
Addended by: Donne Anon L on: 12/16/2019 09:01 AM   Modules accepted: Orders

## 2019-12-16 NOTE — Assessment & Plan Note (Addendum)
[name] is doing well, he continues to lose weight, and eat right. A1c has improved further down to 5.6%, continue current medications.

## 2019-12-16 NOTE — Assessment & Plan Note (Signed)
Wrist continues to improve after about a month and immobilization. MRI arthrogram did show TFCC tear as well as evidence of extensor carpi ulnaris subluxation. Because he is continuing to improve I will keep him in the wrist brace for at least another month before considering an injection, his pain does seem to be referrable to the TFCC rather than the ECU.

## 2019-12-16 NOTE — Assessment & Plan Note (Signed)
Continue atorvastatin 40, still awaiting his lipids, we will probably have him do his labs at the follow-up visit for his wrist.

## 2019-12-16 NOTE — Progress Notes (Signed)
    Procedures performed today:    Hemoglobin A1c performed today, has improved to 5.6%.  Independent interpretation of tests performed by another provider:   I personally reviewed his MRI performed at an outside facility, he has a peripherally located triangular fibrocartilaginous complex tear, he also has evidence of extensor carpi ulnaris subluxation.  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 MRI arthrogram did show TFCC tear as well as evidence of extensor carpi ulnaris subluxation. Because he is continuing to improve I will keep him in the wrist brace for at least another month before considering an injection, his pain does seem to be referrable to the TFCC rather than the ECU.  Controlled type 2 diabetes mellitus without complication, without long-term current use of insulin (HCC) [name] is doing well, he continues to lose weight, and eat right. A1c has improved further down to 5.6%, continue current medications.   Hyperlipidemia with target LDL less than 100 Continue atorvastatin 40, still awaiting his lipids, we will probably have him do his labs at the follow-up visit for his wrist.    ___________________________________________ Bobby Saunders. Benjamin Stain, M.D., ABFM., CAQSM. Primary Care and Sports Medicine [facility]  Adjunct Instructor of Family Medicine  [employer]

## 2020-01-21 ENCOUNTER — Ambulatory Visit: Payer: BC Managed Care – PPO | Admitting: Sports Medicine

## 2020-01-28 ENCOUNTER — Ambulatory Visit (INDEPENDENT_AMBULATORY_CARE_PROVIDER_SITE_OTHER): Payer: BC Managed Care – PPO | Admitting: Sports Medicine

## 2020-01-28 ENCOUNTER — Encounter: Payer: Self-pay | Admitting: Sports Medicine

## 2020-01-28 ENCOUNTER — Other Ambulatory Visit: Payer: Self-pay

## 2020-01-28 VITALS — Ht 70.0 in | Wt 264.0 lb

## 2020-01-28 DIAGNOSIS — G8929 Other chronic pain: Secondary | ICD-10-CM | POA: Diagnosis not present

## 2020-01-28 DIAGNOSIS — M25532 Pain in left wrist: Secondary | ICD-10-CM | POA: Diagnosis not present

## 2020-01-28 DIAGNOSIS — Z23 Encounter for immunization: Secondary | ICD-10-CM

## 2020-01-28 NOTE — Assessment & Plan Note (Signed)
Bobby Saunders returns, he is a pleasant 54 year old male with left wrist pain, pain was referrable more to the triangular fibrocartilaginous complex rather than the extensor carpi ulnaris, though MR arthrogram showed evidence of a TFCC tear and ECU subluxation. We kept him in a brace for a month without significant improvement so today I performed an injection into and around the triangular fibrocartilaginous complex. Continue brace, return to see me in 1 month.

## 2020-01-28 NOTE — Progress Notes (Signed)
    Procedures performed today:    Procedure: Real-time Ultrasound Guided injection of the left triangular fibrocartilaginous complex Device: Samsung HS60  Verbal informed consent obtained.  Time-out conducted.  Noted no overlying erythema, induration, or other signs of local infection.  Skin prepped in a sterile fashion.  Local anesthesia: Topical Ethyl chloride.  With sterile technique and under real time ultrasound guidance:  1 cc Kenalog 40, 1 cc lidocaine injected easily completed without difficulty  Pain immediately resolved suggesting accurate placement of the medication.  Advised to call if fevers/chills, erythema, induration, drainage, or persistent bleeding.  Images permanently stored and available for review in the ultrasound unit.  Impression: Technically successful ultrasound guided injection.  Independent interpretation of tests performed by another provider:   None.  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 We kept him in a brace for a month without significant improvement so today I performed an injection into and around the triangular fibrocartilaginous complex. Continue brace, return to see me in 1 month.    ___________________________________________ [name]. Benjamin Stain, M.D., ABFM., CAQSM. Primary Care and Sports Medicine [place] [facility]  Adjunct Instructor of Family Medicine  [employer]

## 2020-03-02 ENCOUNTER — Other Ambulatory Visit: Payer: Self-pay

## 2020-03-02 ENCOUNTER — Ambulatory Visit: Payer: BC Managed Care – PPO | Admitting: Sports Medicine

## 2020-03-02 ENCOUNTER — Encounter: Payer: Self-pay | Admitting: Sports Medicine

## 2020-03-02 VITALS — BP 118/79 | HR 65 | Ht 70.0 in | Wt 264.0 lb

## 2020-03-02 DIAGNOSIS — E785 Hyperlipidemia, unspecified: Secondary | ICD-10-CM | POA: Diagnosis not present

## 2020-03-02 DIAGNOSIS — E119 Type 2 diabetes mellitus without complications: Secondary | ICD-10-CM | POA: Diagnosis not present

## 2020-03-02 DIAGNOSIS — R21 Rash and other nonspecific skin eruption: Secondary | ICD-10-CM

## 2020-03-02 LAB — POCT GLYCOSYLATED HEMOGLOBIN (HGB A1C): Hemoglobin A1C: 5.6 % (ref 4.0–5.6)

## 2020-03-02 MED ORDER — CLOBETASOL PROPIONATE 0.05 % EX CREA: 1.0000 "application " | TOPICAL_CREAM | Freq: Two times a day (BID) | CUTANEOUS | 2 refills | Status: DC

## 2020-03-02 NOTE — Assessment & Plan Note (Signed)
We did start atorvastatin, I need to recheck his labs today.

## 2020-03-02 NOTE — Assessment & Plan Note (Signed)
A year ago we started Lotrisone for what appeared to be a fungal versus dyshidrotic eczema rash. The plan was to biopsy it if persistent after 2 weeks. The rash has stayed, but it is asymptomatic. We are going to switch from Lotrisone to topical clobetasol, if this does not work then we can frankly just leave it alone.

## 2020-03-02 NOTE — Progress Notes (Signed)
    Procedures performed today:    None.  Independent interpretation of notes and tests performed by another provider:   None.  Brief History, Exam, Impression, and Recommendations:    Controlled type 2 diabetes mellitus without complication, without long-term current use of insulin (HCC) [name] returns, he is a little bit early for his hemoglobin A1c, it is still 5.6%, no change in plan, I not really want to see this again for a year.  Hyperlipidemia with target LDL less than 100 We did start atorvastatin, I need to recheck his labs today.  Skin rash A year ago we started Lotrisone for what appeared to be a fungal versus dyshidrotic eczema rash. The plan was to biopsy it if persistent after 2 weeks. The rash has stayed, but it is asymptomatic. We are going to switch from Lotrisone to topical clobetasol, if this does not work then we can frankly just leave it alone.    ___________________________________________ [name]. Benjamin Stain, M.D., ABFM., CAQSM. Primary Care and Sports Medicine [place] [facility]  Adjunct Instructor of Family Medicine  [employer]

## 2020-03-02 NOTE — Assessment & Plan Note (Signed)
[name] returns, he is a little bit early for his hemoglobin A1c, it is still 5.6%, no change in plan, I not really want to see this again for a year.

## 2020-03-03 LAB — COMPLETE METABOLIC PANEL WITH GFR
AG Ratio: 2.1 (calc) (ref 1.0–2.5)
ALT: 21 U/L (ref 9–46)
AST: 18 U/L (ref 10–35)
Albumin: 4.4 g/dL (ref 3.6–5.1)
Alkaline phosphatase (APISO): 46 U/L (ref 35–144)
BUN: 19 mg/dL (ref 7–25)
CO2: 26 mmol/L (ref 20–32)
Calcium: 10 mg/dL (ref 8.6–10.3)
Chloride: 105 mmol/L (ref 98–110)
Creat: 0.89 mg/dL (ref 0.70–1.33)
GFR, Est African American: 113 mL/min/{1.73_m2} (ref >=60)
GFR, Est Non African American: 98 mL/min/{1.73_m2} (ref >=60)
Globulin: 2.1 g/dL (calc) (ref 1.9–3.7)
Glucose, Bld: 107 mg/dL — ABNORMAL HIGH (ref 65–99)
Potassium: 4.4 mmol/L (ref 3.5–5.3)
Sodium: 142 mmol/L (ref 135–146)
Total Bilirubin: 0.8 mg/dL (ref 0.2–1.2)
Total Protein: 6.5 g/dL (ref 6.1–8.1)

## 2020-03-03 LAB — LIPID PANEL W/REFLEX DIRECT LDL
Cholesterol: 138 mg/dL (ref <200)
HDL: 43 mg/dL (ref >=40)
LDL Cholesterol (Calc): 79 mg/dL (calc)
Non-HDL Cholesterol (Calc): 95 mg/dL (calc) (ref <130)
Total CHOL/HDL Ratio: 3.2 (calc) (ref <5.0)
Triglycerides: 80 mg/dL (ref <150)

## 2020-03-03 LAB — CBC
HCT: 50.6 % — ABNORMAL HIGH (ref 38.5–50.0)
Hemoglobin: 17 g/dL (ref 13.2–17.1)
MCH: 31 pg (ref 27.0–33.0)
MCHC: 33.6 g/dL (ref 32.0–36.0)
MCV: 92.2 fL (ref 80.0–100.0)
MPV: 11.2 fL (ref 7.5–12.5)
Platelets: 206 10*3/uL (ref 140–400)
RBC: 5.49 10*6/uL (ref 4.20–5.80)
RDW: 13.3 % (ref 11.0–15.0)
WBC: 7.5 10*3/uL (ref 3.8–10.8)

## 2020-03-03 LAB — TSH: TSH: 1.37 mIU/L (ref 0.40–4.50)

## 2020-05-18 ENCOUNTER — Encounter: Payer: Self-pay | Admitting: Sports Medicine

## 2020-08-06 ENCOUNTER — Other Ambulatory Visit: Payer: Self-pay | Admitting: Sports Medicine

## 2020-08-06 DIAGNOSIS — E119 Type 2 diabetes mellitus without complications: Secondary | ICD-10-CM

## 2020-09-01 LAB — HM DIABETES EYE EXAM

## 2020-09-09 ENCOUNTER — Ambulatory Visit (INDEPENDENT_AMBULATORY_CARE_PROVIDER_SITE_OTHER): Payer: BC Managed Care – PPO

## 2020-09-09 ENCOUNTER — Ambulatory Visit (INDEPENDENT_AMBULATORY_CARE_PROVIDER_SITE_OTHER): Payer: BC Managed Care – PPO | Admitting: Sports Medicine

## 2020-09-09 ENCOUNTER — Other Ambulatory Visit: Payer: Self-pay

## 2020-09-09 DIAGNOSIS — M5412 Radiculopathy, cervical region: Secondary | ICD-10-CM | POA: Insufficient documentation

## 2020-09-09 DIAGNOSIS — M25532 Pain in left wrist: Secondary | ICD-10-CM

## 2020-09-09 DIAGNOSIS — G8929 Other chronic pain: Secondary | ICD-10-CM

## 2020-09-09 MED ORDER — PREDNISONE 50 MG PO TABS: ORAL_TABLET | ORAL | 0 refills | Status: DC

## 2020-09-09 NOTE — Progress Notes (Signed)
    Procedures performed today:    Procedure: Real-time Ultrasound Guided injection of the left TFCC Device: Samsung HS60  Verbal informed consent obtained.  Time-out conducted.  Noted no overlying erythema, induration, or other signs of local infection.  Skin prepped in a sterile fashion.  Local anesthesia: Topical Ethyl chloride.  With sterile technique and under real time ultrasound guidance:  1 cc kenalog 40, 1 cc lidocaine injected just distal to the ulnar styloid process around the TFCC.   Completed without difficulty  Pain immediately resolved suggesting accurate placement of the medication.  Advised to call if fevers/chills, erythema, induration, drainage, or persistent bleeding.  Images permanently stored and available for review in PACS.  Impression: Technically successful ultrasound guided injection.  Independent interpretation of notes and tests performed by another provider:   None.  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  Radiculitis of left cervical region For the past 3 weeks client has also started to have neck pain with radiation around his deltoid, down the posterior lateral aspect of his upper arm, forearm, to the third through fifth fingers. This represents a C7 versus C8 distribution. Adding 5 days of prednisone, cervical spine x-rays, cervical spine conditioning exercises, return in a month to 6 weeks, MRI for interventional planning if no better.    ___________________________________________ [name]. Benjamin Stain, M.D., ABFM., CAQSM. Primary Care and Sports Medicine [facility]  Adjunct Instructor of Family Medicine  [employer]

## 2020-09-09 NOTE — Assessment & Plan Note (Signed)
[name] returns, he is a pleasant 54 year old male with a known triangular fibrocartilaginous tear and extensor carpi ulnaris subluxation on an MR arthrogram per I injected around his TFCC back in February and he did well until now, repeat injection today, return to see me as needed for this.

## 2020-09-09 NOTE — Assessment & Plan Note (Signed)
For the past 3 weeks client has also started to have neck pain with radiation around his deltoid, down the posterior lateral aspect of his upper arm, forearm, to the third through fifth fingers. This represents a C7 versus C8 distribution. Adding 5 days of prednisone, cervical spine x-rays, cervical spine conditioning exercises, return in a month to 6 weeks, MRI for interventional planning if no better.

## 2020-10-04 ENCOUNTER — Ambulatory Visit: Payer: BC Managed Care – PPO | Admitting: Sports Medicine

## 2020-10-05 ENCOUNTER — Ambulatory Visit (INDEPENDENT_AMBULATORY_CARE_PROVIDER_SITE_OTHER): Payer: BC Managed Care – PPO | Admitting: Sports Medicine

## 2020-10-05 DIAGNOSIS — M5412 Radiculopathy, cervical region: Secondary | ICD-10-CM | POA: Diagnosis not present

## 2020-10-05 DIAGNOSIS — G8929 Other chronic pain: Secondary | ICD-10-CM

## 2020-10-05 DIAGNOSIS — M25532 Pain in left wrist: Secondary | ICD-10-CM

## 2020-10-05 MED ORDER — TRAMADOL HCL 50 MG PO TABS: 50.0000 mg | ORAL_TABLET | Freq: Three times a day (TID) | ORAL | 0 refills | Status: DC | PRN

## 2020-10-05 MED ORDER — GABAPENTIN 600 MG PO TABS: ORAL_TABLET | ORAL | 3 refills | Status: DC

## 2020-10-05 NOTE — Progress Notes (Addendum)
    Procedures performed today:    None.  Independent interpretation of notes and tests performed by another provider:   MRI from [facility] reviewed, there is a large C6-C7 disc herniation.  Brief History, Exam, Impression, and Recommendations:    Radiculitis of left cervical region This is a pleasant 54 year old male, for approximately 7 or 8 weeks now he has had severe pain in his neck with radiation around the deltoid, down the posterior lateral arm through the third through fifth fingers, C7 versus C8 distribution. Better with abduction of the left arm, he failed conservative measures including physician directed therapy, prednisone, x-rays show DDD, at this point we will proceed with MRI for interventional planning, likely left C6-C7 or C7-T1. Increasing gabapentin to 600 twice daily, adding tramadol for pain relief in the meantime. I will probably go ahead and order the epidural once I see his MRI results.  MRI from [facility] reviewed, there is a large C6-C7 disc herniation, I am going to go ahead and order the left C6-C7 interlaminar epidural, he should see me 1 month after the injection.  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    ___________________________________________ [name]. Benjamin Stain, M.D., ABFM., CAQSM. Primary Care and Sports Medicine  [facility]  Adjunct Instructor of Family Medicine  [employer]

## 2020-10-05 NOTE — Assessment & Plan Note (Addendum)
This is a pleasant 54 year old male, for approximately 7 or 8 weeks now he has had severe pain in his neck with radiation around the deltoid, down the posterior lateral arm through the third through fifth fingers, C7 versus C8 distribution. Better with abduction of the left arm, he failed conservative measures including physician directed therapy, prednisone, x-rays show DDD, at this point we will proceed with MRI for interventional planning, likely left C6-C7 or C7-T1. Increasing gabapentin to 600 twice daily, adding tramadol for pain relief in the meantime. I will probably go ahead and order the epidural once I see his MRI results.  MRI from [facility] reviewed, there is a large C6-C7 disc herniation, I am going to go ahead and order the left C6-C7 interlaminar epidural, he should see me 1 month after the injection.

## 2020-10-05 NOTE — Assessment & Plan Note (Signed)
Bobby Saunders has done well after a left triangular fibrocartilaginous complex injection at the last visit, no further intervention needed for his wrist.

## 2020-10-11 ENCOUNTER — Telehealth: Payer: Self-pay | Admitting: Sports Medicine

## 2020-10-11 NOTE — Telephone Encounter (Signed)
MRI from [facility] reviewed, there is a large C6-C7 disc herniation, I am going to go ahead and order the left C6-C7 interlaminar epidural, he should see me 1 month after the injection.

## 2020-10-11 NOTE — Addendum Note (Signed)
Addended by: Monica Becton on: 10/11/2020 05:08 PM   Modules accepted: Orders

## 2020-10-12 DIAGNOSIS — M5412 Radiculopathy, cervical region: Secondary | ICD-10-CM

## 2020-10-12 NOTE — Telephone Encounter (Signed)
Patient aware of results.  He understands the next steps and the timeline of events.

## 2020-10-13 MED ORDER — TRAMADOL HCL 50 MG PO TABS: 50.0000 mg | ORAL_TABLET | Freq: Every day | ORAL | 3 refills | Status: DC | PRN

## 2020-10-19 NOTE — Telephone Encounter (Signed)
Referral was done back on 10/11/2020.  This shows up under the referrals not under the imaging tab.  Not sure this really needs me as a physician.

## 2020-10-21 ENCOUNTER — Ambulatory Visit: Payer: BC Managed Care – PPO | Admitting: Sports Medicine

## 2020-10-25 ENCOUNTER — Telehealth: Payer: Self-pay | Admitting: Sports Medicine

## 2020-10-25 NOTE — Telephone Encounter (Signed)
Bobby Saunders called and states that he has not heard anything from Dr Jon Gills office that you referred him to on November 9th and it has left him dealing with a good amount of pain. He states you did call him in medicine and he has been taking one in the morning and one in the evening to help him deal with the pain and now he is about out of this medicine, and the pharmacy will not refill it because it has not been 30 days. Is there anything you can give him to take to help and I will call first thing in the morning to Dr.O'toole's office myself to find out what is going on with this referral. Please advise patient of what you can do. Thank you

## 2020-10-26 ENCOUNTER — Telehealth: Payer: Self-pay | Admitting: Sports Medicine

## 2020-10-26 DIAGNOSIS — M5412 Radiculopathy, cervical region: Secondary | ICD-10-CM

## 2020-10-26 MED ORDER — TRAMADOL HCL 50 MG PO TABS: 50.0000 mg | ORAL_TABLET | Freq: Three times a day (TID) | ORAL | 3 refills | Status: DC | PRN

## 2020-10-26 NOTE — Telephone Encounter (Signed)
Dr.T Send order to [facility] and reminder to order pharmacy to refill his meds sooner since he's been taking 2 pills a day for the pain

## 2020-10-26 NOTE — Telephone Encounter (Signed)
Switching injection order to [facility] imaging and refilling pain medication, Christal please call [place] imaging to schedule injection.

## 2020-10-26 NOTE — Telephone Encounter (Signed)
Left patient info on Cathy's VM at [facility] for her to call and schedule.

## 2020-10-26 NOTE — Telephone Encounter (Signed)
Spoke with patient, he was scheduled at [facility] office for next Friday.  He is aware that a refill for the Tramadol was sent in today.  Call to pharmacy to make sure he can fill early since he is having to take 2 per day.

## 2021-02-12 DIAGNOSIS — M5441 Lumbago with sciatica, right side: Secondary | ICD-10-CM | POA: Diagnosis not present

## 2021-02-12 DIAGNOSIS — G8929 Other chronic pain: Secondary | ICD-10-CM | POA: Diagnosis not present

## 2021-02-12 DIAGNOSIS — M5442 Lumbago with sciatica, left side: Secondary | ICD-10-CM | POA: Diagnosis not present

## 2021-02-13 NOTE — Telephone Encounter (Signed)
I spent 5 total minutes of online digital evaluation and management services. 

## 2021-02-13 NOTE — Assessment & Plan Note (Signed)
Bobby Saunders did well with bilateral L4-L5 transforaminal epidurals at [facility], referral back for this injection.

## 2021-04-26 ENCOUNTER — Other Ambulatory Visit: Payer: Self-pay | Admitting: Sports Medicine

## 2021-04-26 DIAGNOSIS — M5412 Radiculopathy, cervical region: Secondary | ICD-10-CM

## 2021-05-26 ENCOUNTER — Other Ambulatory Visit: Payer: Self-pay | Admitting: Sports Medicine

## 2021-05-26 DIAGNOSIS — M5412 Radiculopathy, cervical region: Secondary | ICD-10-CM

## 2021-06-24 ENCOUNTER — Other Ambulatory Visit: Payer: Self-pay | Admitting: Sports Medicine

## 2021-06-24 DIAGNOSIS — M5412 Radiculopathy, cervical region: Secondary | ICD-10-CM

## 2021-07-21 ENCOUNTER — Ambulatory Visit (INDEPENDENT_AMBULATORY_CARE_PROVIDER_SITE_OTHER): Payer: BC Managed Care – PPO

## 2021-07-21 ENCOUNTER — Ambulatory Visit: Payer: BC Managed Care – PPO | Admitting: Sports Medicine

## 2021-07-21 ENCOUNTER — Other Ambulatory Visit: Payer: Self-pay

## 2021-07-21 DIAGNOSIS — M4807 Spinal stenosis, lumbosacral region: Secondary | ICD-10-CM | POA: Diagnosis not present

## 2021-07-21 DIAGNOSIS — M5441 Lumbago with sciatica, right side: Secondary | ICD-10-CM

## 2021-07-21 DIAGNOSIS — M5412 Radiculopathy, cervical region: Secondary | ICD-10-CM | POA: Diagnosis not present

## 2021-07-21 DIAGNOSIS — G8929 Other chronic pain: Secondary | ICD-10-CM

## 2021-07-21 DIAGNOSIS — M5442 Lumbago with sciatica, left side: Secondary | ICD-10-CM

## 2021-07-21 MED ORDER — PREGABALIN 75 MG PO CAPS: 75.0000 mg | ORAL_CAPSULE | Freq: Three times a day (TID) | ORAL | 3 refills | Status: DC | PRN

## 2021-07-21 MED ORDER — TRAMADOL HCL 50 MG PO TABS: 50.0000 mg | ORAL_TABLET | Freq: Three times a day (TID) | ORAL | 3 refills | Status: DC | PRN

## 2021-07-21 NOTE — Assessment & Plan Note (Addendum)
This is a pleasant 55 year old male, he has a history of lumbar spinal stenosis at multiple levels, he is supposed laminectomy and facetectomy at L3-L4 with Dr. Yevette Edwards about 7 years ago, did well. Unfortunately starting to have increasing pain in the buttocks with radiation down but not past the knee, occasional numbness, tiredness in the legs that get better with sitting. At this point we are to switch him from gabapentin to Lyrica, refill tramadol, we are going to proceed with x-rays and an updated MRI for epidural planning, likely at the L2-L3 level, if this fails he will need additional decompression at the L2-L3 level. Return to see me in 1 month. If he does need injections they will be with Dr. Laurian Brim.

## 2021-07-21 NOTE — Progress Notes (Signed)
    Procedures performed today:    None.  Independent interpretation of notes and tests performed by another provider:   I did personally review lumbar spine MRI from a couple years ago, we do see his known spinal stenosis at L3-L4 which has since been decompressed, he also has spinal stenosis at the L2-L3 level, mild on the MRI from a couple years ago.  Brief History, Exam, Impression, and Recommendations:    Low back pain with spinal stenosis This is a pleasant 55 year old male, he has a history of lumbar spinal stenosis at multiple levels, he is supposed laminectomy and facetectomy at L3-L4 with Dr. Yevette Edwards about 7 years ago, did well. Unfortunately starting to have increasing pain in the buttocks with radiation down but not past the knee, occasional numbness, tiredness in the legs that get better with sitting. At this point we are to switch him from gabapentin to Lyrica, refill tramadol, we are going to proceed with x-rays and an updated MRI for epidural planning, likely at the L2-L3 level, if this fails he will need additional decompression at the L2-L3 level. Return to see me in 1 month. If he does need injections they will be with Dr. Laurian Brim.    ___________________________________________ Bobby Saunders. Benjamin Stain, M.D., ABFM., CAQSM. Primary Care and Sports Medicine [place] [facility]  Adjunct Instructor of Family Medicine  [employer]

## 2021-08-22 ENCOUNTER — Ambulatory Visit (INDEPENDENT_AMBULATORY_CARE_PROVIDER_SITE_OTHER): Payer: BC Managed Care – PPO | Admitting: Sports Medicine

## 2021-08-22 ENCOUNTER — Other Ambulatory Visit: Payer: Self-pay

## 2021-08-22 DIAGNOSIS — G8929 Other chronic pain: Secondary | ICD-10-CM | POA: Diagnosis not present

## 2021-08-22 DIAGNOSIS — K219 Gastro-esophageal reflux disease without esophagitis: Secondary | ICD-10-CM | POA: Diagnosis not present

## 2021-08-22 DIAGNOSIS — M5442 Lumbago with sciatica, left side: Secondary | ICD-10-CM

## 2021-08-22 DIAGNOSIS — M5441 Lumbago with sciatica, right side: Secondary | ICD-10-CM

## 2021-08-22 MED ORDER — PANTOPRAZOLE SODIUM 40 MG PO TBEC: 40.0000 mg | DELAYED_RELEASE_TABLET | Freq: Every day | ORAL | 3 refills | Status: DC

## 2021-08-22 NOTE — Assessment & Plan Note (Addendum)
Bobby Saunders returns, he is a very pleasant 55 year old male, history of lumbar spinal stenosis, multilevel post laminectomy and facetectomy at L3-L4 with Dr. Yevette Edwards about 7 years ago, initially did well. More recently he has been having increasing pain in the low back, symptomatology consistent with lumbar spinal stenosis, it sounds like he did have an L4-L5 bilateral transforaminal epidural by Dr. Maurice Small several months ago, did not notice any relief. Ultimately we obtained an updated lumbar spine MRI that showed severe spinal stenosis at the operative level, L3-L4 with lesser so spinal stenosis at the levels above and below. Bobby Saunders has seemed to work relatively well, Bobby Saunders also helps to some degree. No changes in medication dosages, I would however recommend that he consider bilateral transforaminal epidurals at the L3-L4 level in the future. I do suspect further operative intervention would likely necessitate a 2 or 3 level fusion.  Update: Patient would like to try another epidural, I do think it should be done at the L3-L4 level, bilateral transforaminal this time. He would prefer Dr. Laurian Brim, orders placed.

## 2021-08-22 NOTE — Progress Notes (Signed)
    Procedures performed today:    None.  Independent interpretation of notes and tests performed by another provider:   None.  Brief History, Exam, Impression, and Recommendations:    Low back pain with spinal stenosis Bobby Saunders returns, he is a very pleasant 55 year old male, history of lumbar spinal stenosis, multilevel post laminectomy and facetectomy at L3-L4 with Dr. Yevette Edwards about 7 years ago, initially did well. More recently he has been having increasing pain in the low back, symptomatology consistent with lumbar spinal stenosis, it sounds like he did have an L4-L5 bilateral transforaminal epidural by Dr. Maurice Small several months ago, did not notice any relief. Ultimately we obtained an updated lumbar spine MRI that showed severe spinal stenosis at the operative level, L3-L4 with lesser so spinal stenosis at the levels above and below. Lyrica has seemed to work relatively well, tramadol also helps to some degree. No changes in medication dosages, I would however recommend that he consider bilateral transforaminal epidurals at the L3-L4 level in the future. I do suspect further operative intervention would likely necessitate a 2 or 3 level fusion.  Update: Patient would like to try another epidural, I do think it should be done at the L3-L4 level, bilateral transforaminal this time. He would prefer Dr. Laurian Brim, orders placed.   GERD (gastroesophageal reflux disease) Adding pantoprazole per patient request. Referral to GI if insufficient improvement after 6 weeks.    ___________________________________________ Bobby Saunders. Bobby Saunders, M.D., ABFM., CAQSM. Primary Care and Sports Medicine [place] [facility]  Adjunct Instructor of Family Medicine  [employer]

## 2021-08-22 NOTE — Assessment & Plan Note (Signed)
Adding pantoprazole per patient request. Referral to GI if insufficient improvement after 6 weeks.

## 2021-09-14 ENCOUNTER — Ambulatory Visit (INDEPENDENT_AMBULATORY_CARE_PROVIDER_SITE_OTHER): Payer: BC Managed Care – PPO

## 2021-09-14 ENCOUNTER — Other Ambulatory Visit: Payer: Self-pay

## 2021-09-14 ENCOUNTER — Ambulatory Visit (INDEPENDENT_AMBULATORY_CARE_PROVIDER_SITE_OTHER): Payer: BC Managed Care – PPO | Admitting: Sports Medicine

## 2021-09-14 DIAGNOSIS — M25532 Pain in left wrist: Secondary | ICD-10-CM

## 2021-09-14 DIAGNOSIS — G8929 Other chronic pain: Secondary | ICD-10-CM

## 2021-09-14 DIAGNOSIS — R7401 Elevation of levels of liver transaminase levels: Secondary | ICD-10-CM | POA: Diagnosis not present

## 2021-09-14 NOTE — Progress Notes (Addendum)
    Procedures performed today:    Procedure: Real-time Ultrasound Guided injection of the left TFCC Device: Samsung HS60  Verbal informed consent obtained.  Time-out conducted.  Noted no overlying erythema, induration, or other signs of local infection.  Skin prepped in a sterile fashion.  Local anesthesia: Topical Ethyl chloride.  With sterile technique and under real time ultrasound guidance: Noted TFCC fraying, 1 cc kenalog 40, 1 cc lidocaine injected easily. Completed without difficulty  Advised to call if fevers/chills, erythema, induration, drainage, or persistent bleeding.  Images permanently stored and available for review in PACS.  Impression: Technically successful ultrasound guided injection.  Independent interpretation of notes and tests performed by another provider:   None.  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 Return as needed.  Transaminitis Update 11/17/2021: A1c is up, liver function is up, needs to work aggressively on weight loss, we can recheck in 3 to 6 months.    ___________________________________________ Bobby Saunders. Benjamin Stain, M.D., ABFM., CAQSM. Primary Care and Sports Medicine [place] [facility]  Adjunct Instructor of Family Medicine  [employer]

## 2021-09-14 NOTE — Assessment & Plan Note (Signed)
Bobby Saunders is a very pleasant 55 year old male, known triangular fibrocartilaginous complex injury, as well as extensor carpi ulnaris subluxation, he responded well to a triangular fibrocartilaginous complex injection in October 2021, recurrence of pain, repeated today. Return as needed.

## 2021-09-19 ENCOUNTER — Other Ambulatory Visit: Payer: Self-pay | Admitting: Sports Medicine

## 2021-09-19 NOTE — Telephone Encounter (Signed)
Patient has not had labs since 05/2020 . Sending 30 day supply

## 2021-09-20 ENCOUNTER — Other Ambulatory Visit: Payer: Self-pay | Admitting: Sports Medicine

## 2021-09-20 DIAGNOSIS — E119 Type 2 diabetes mellitus without complications: Secondary | ICD-10-CM

## 2021-10-19 ENCOUNTER — Other Ambulatory Visit: Payer: Self-pay | Admitting: Sports Medicine

## 2021-10-19 DIAGNOSIS — E119 Type 2 diabetes mellitus without complications: Secondary | ICD-10-CM

## 2021-10-20 ENCOUNTER — Encounter: Payer: Self-pay | Admitting: Sports Medicine

## 2021-10-20 ENCOUNTER — Other Ambulatory Visit: Payer: Self-pay | Admitting: Sports Medicine

## 2021-10-20 MED ORDER — OZEMPIC (1 MG/DOSE) 4 MG/3ML ~~LOC~~ SOPN: PEN_INJECTOR | SUBCUTANEOUS | 0 refills | Status: DC

## 2021-11-02 ENCOUNTER — Telehealth: Payer: Self-pay | Admitting: Sports Medicine

## 2021-11-02 ENCOUNTER — Other Ambulatory Visit: Payer: Self-pay | Admitting: Sports Medicine

## 2021-11-02 DIAGNOSIS — Z1159 Encounter for screening for other viral diseases: Secondary | ICD-10-CM

## 2021-11-02 DIAGNOSIS — E119 Type 2 diabetes mellitus without complications: Secondary | ICD-10-CM

## 2021-11-02 DIAGNOSIS — Z5320 Procedure and treatment not carried out because of patient's decision for unspecified reasons: Secondary | ICD-10-CM

## 2021-11-02 DIAGNOSIS — E291 Testicular hypofunction: Secondary | ICD-10-CM

## 2021-11-02 NOTE — Telephone Encounter (Signed)
LVM for patient to call back to get this appt scheduled. AM 

## 2021-11-02 NOTE — Telephone Encounter (Signed)
Pt's wife called in inquiring about orders for bloodwork. She said pt was told at last appt that he would need labs to get med refills. Her number is [phone].

## 2021-11-03 NOTE — Telephone Encounter (Signed)
Attempted to contact pt's spouse.  No answer and no VM box set up.  Will try again.  Tiajuana Amass, CMA

## 2021-11-03 NOTE — Telephone Encounter (Signed)
Labs ordered.

## 2021-11-17 DIAGNOSIS — R7401 Elevation of levels of liver transaminase levels: Secondary | ICD-10-CM | POA: Insufficient documentation

## 2021-11-17 LAB — CBC
HCT: 49.4 % (ref 38.5–50.0)
Hemoglobin: 17.1 g/dL (ref 13.2–17.1)
MCH: 31.7 pg (ref 27.0–33.0)
MCHC: 34.6 g/dL (ref 32.0–36.0)
MCV: 91.5 fL (ref 80.0–100.0)
MPV: 11.7 fL (ref 7.5–12.5)
Platelets: 198 10*3/uL (ref 140–400)
RBC: 5.4 10*6/uL (ref 4.20–5.80)
RDW: 13 % (ref 11.0–15.0)
WBC: 7.9 10*3/uL (ref 3.8–10.8)

## 2021-11-17 LAB — LIPID PANEL
Cholesterol: 155 mg/dL (ref <200)
HDL: 45 mg/dL (ref >=40)
LDL Cholesterol (Calc): 94 mg/dL (calc)
Non-HDL Cholesterol (Calc): 110 mg/dL (calc) (ref <130)
Total CHOL/HDL Ratio: 3.4 (calc) (ref <5.0)
Triglycerides: 72 mg/dL (ref <150)

## 2021-11-17 LAB — PSA, TOTAL AND FREE
PSA, % Free: 33 % (calc) (ref >25)
PSA, Free: 0.2 ng/mL
PSA, Total: 0.6 ng/mL (ref <=4.0)

## 2021-11-17 LAB — COMPREHENSIVE METABOLIC PANEL
AG Ratio: 2 (calc) (ref 1.0–2.5)
ALT: 61 U/L — ABNORMAL HIGH (ref 9–46)
AST: 84 U/L — ABNORMAL HIGH (ref 10–35)
Albumin: 4.1 g/dL (ref 3.6–5.1)
Alkaline phosphatase (APISO): 63 U/L (ref 35–144)
BUN: 11 mg/dL (ref 7–25)
CO2: 27 mmol/L (ref 20–32)
Calcium: 9.3 mg/dL (ref 8.6–10.3)
Chloride: 104 mmol/L (ref 98–110)
Creat: 1.08 mg/dL (ref 0.70–1.30)
Globulin: 2.1 g/dL (calc) (ref 1.9–3.7)
Glucose, Bld: 99 mg/dL (ref 65–99)
Potassium: 4.3 mmol/L (ref 3.5–5.3)
Sodium: 139 mmol/L (ref 135–146)
Total Bilirubin: 1.3 mg/dL — ABNORMAL HIGH (ref 0.2–1.2)
Total Protein: 6.2 g/dL (ref 6.1–8.1)

## 2021-11-17 LAB — HEPATITIS C ANTIBODY
Hepatitis C Ab: NONREACTIVE
SIGNAL TO CUT-OFF: 0.05 (ref <1.00)

## 2021-11-17 LAB — HEMOGLOBIN A1C
Hgb A1c MFr Bld: 6.7 % of total Hgb — ABNORMAL HIGH (ref <5.7)
Mean Plasma Glucose: 146 mg/dL
eAG (mmol/L): 8.1 mmol/L

## 2021-11-17 LAB — TSH: TSH: 0.83 mIU/L (ref 0.40–4.50)

## 2021-11-17 NOTE — Assessment & Plan Note (Signed)
Update 11/17/2021: A1c is up, liver function is up, needs to work aggressively on weight loss, we can recheck in 3 to 6 months.

## 2021-11-18 LAB — MICROALBUMIN / CREATININE URINE RATIO
Creatinine, Urine: 155 mg/dL (ref 20–320)
Microalb Creat Ratio: 5 mcg/mg creat (ref <30)
Microalb, Ur: 0.7 mg/dL

## 2021-12-30 ENCOUNTER — Ambulatory Visit: Payer: BC Managed Care – PPO

## 2022-01-01 ENCOUNTER — Other Ambulatory Visit: Payer: Self-pay

## 2022-01-01 ENCOUNTER — Ambulatory Visit: Payer: BC Managed Care – PPO | Admitting: Sports Medicine

## 2022-01-01 DIAGNOSIS — K810 Acute cholecystitis: Secondary | ICD-10-CM

## 2022-01-01 DIAGNOSIS — R1011 Right upper quadrant pain: Secondary | ICD-10-CM | POA: Diagnosis not present

## 2022-01-01 MED ORDER — OXYCODONE-ACETAMINOPHEN 5-325 MG PO TABS: 1.0000 | ORAL_TABLET | Freq: Three times a day (TID) | ORAL | 0 refills | Status: DC | PRN

## 2022-01-01 NOTE — Assessment & Plan Note (Addendum)
This is a pleasant 56 year old male, for the past several days has had severe right upper quadrant abdominal pain, colicky. On exam he has severe tenderness right upper quadrant with guarding. I do think he is having either biliary colic/choledocholithiasis. Considering severity of abdominal pain, guarding and rigidity we will add a CT abdomen and pelvis with oral and IV contrast stat, CBC, CMP, amylase, lipase, urinalysis. I would also like an additional ultrasound to fully evaluate his CBD. Oxycodone for pain.  Update:  CT and ultrasound show findings consistent with acute cholecystitis, white blood cell count was also elevated, this is more severe than typical biliary colic and usually indicates that the gallbladder needs to come out today.  I spoke to one of the surgical PAs on-call for the hospital, her recommendation was for the patient to go ahead and had to draw bridge ED, and they would likely transfer him to [facility] or [facility], this is likely a faster way to do it then going straight to [facility] or [facility] through the ED.

## 2022-01-01 NOTE — Progress Notes (Addendum)
° ° °  Procedures performed today:    None.  Independent interpretation of notes and tests performed by another provider:   None.  Brief History, Exam, Impression, and Recommendations:    Acute cholecystitis This is a pleasant 56 year old male, for the past several days has had severe right upper quadrant abdominal pain, colicky. On exam he has severe tenderness right upper quadrant with guarding. I do think he is having either biliary colic/choledocholithiasis. Considering severity of abdominal pain, guarding and rigidity we will add a CT abdomen and pelvis with oral and IV contrast stat, CBC, CMP, amylase, lipase, urinalysis. I would also like an additional ultrasound to fully evaluate his CBD. Oxycodone for pain.  Update:  CT and ultrasound show findings consistent with acute cholecystitis, white blood cell count was also elevated, this is more severe than typical biliary colic and usually indicates that the gallbladder needs to come out today.  I spoke to one of the surgical PAs on-call for the hospital, her recommendation was for the patient to go ahead and had to draw bridge ED, and they would likely transfer him to [facility] or [facility], this is likely a faster way to do it then going straight to [facility] or [facility] through the ED.   Level 5 template as we did make a decision today regarding hospitalization.  ___________________________________________ [name]. Benjamin Stain, M.D., ABFM., CAQSM. Primary Care and Sports Medicine [place] [facility]  Adjunct Instructor of Family Medicine  [employer]

## 2022-01-02 ENCOUNTER — Other Ambulatory Visit: Payer: Self-pay

## 2022-01-02 ENCOUNTER — Other Ambulatory Visit: Payer: BC Managed Care – PPO

## 2022-01-02 ENCOUNTER — Ambulatory Visit (INDEPENDENT_AMBULATORY_CARE_PROVIDER_SITE_OTHER): Payer: BC Managed Care – PPO

## 2022-01-02 ENCOUNTER — Inpatient Hospital Stay (HOSPITAL_COMMUNITY)
Admission: AD | Admit: 2022-01-02 | Discharge: 2022-01-04 | DRG: 419 | Disposition: A | Discharge status: Home / Self Care | Payer: BC Managed Care – PPO | Source: Ambulatory Visit | Attending: Surgery | Admitting: Surgery

## 2022-01-02 DIAGNOSIS — R112 Nausea with vomiting, unspecified: Secondary | ICD-10-CM | POA: Diagnosis not present

## 2022-01-02 DIAGNOSIS — M961 Postlaminectomy syndrome, not elsewhere classified: Secondary | ICD-10-CM | POA: Diagnosis present

## 2022-01-02 DIAGNOSIS — K810 Acute cholecystitis: Secondary | ICD-10-CM | POA: Diagnosis present

## 2022-01-02 DIAGNOSIS — Z20822 Contact with and (suspected) exposure to covid-19: Secondary | ICD-10-CM | POA: Diagnosis present

## 2022-01-02 DIAGNOSIS — K82A1 Gangrene of gallbladder in cholecystitis: Secondary | ICD-10-CM | POA: Diagnosis present

## 2022-01-02 DIAGNOSIS — R1011 Right upper quadrant pain: Secondary | ICD-10-CM

## 2022-01-02 DIAGNOSIS — Z8249 Family history of ischemic heart disease and other diseases of the circulatory system: Secondary | ICD-10-CM

## 2022-01-02 DIAGNOSIS — Z83438 Family history of other disorder of lipoprotein metabolism and other lipidemia: Secondary | ICD-10-CM

## 2022-01-02 DIAGNOSIS — K8012 Calculus of gallbladder with acute and chronic cholecystitis without obstruction: Principal | ICD-10-CM | POA: Diagnosis present

## 2022-01-02 DIAGNOSIS — G4730 Sleep apnea, unspecified: Secondary | ICD-10-CM | POA: Diagnosis present

## 2022-01-02 DIAGNOSIS — Z720 Tobacco use: Secondary | ICD-10-CM

## 2022-01-02 DIAGNOSIS — E559 Vitamin D deficiency, unspecified: Secondary | ICD-10-CM | POA: Diagnosis present

## 2022-01-02 LAB — CBC WITH DIFFERENTIAL/PLATELET
Absolute Monocytes: 983 cells/uL — ABNORMAL HIGH (ref 200–950)
Basophils Absolute: 59 cells/uL (ref 0–200)
Basophils Relative: 0.5 %
Eosinophils Absolute: 386 cells/uL (ref 15–500)
Eosinophils Relative: 3.3 %
HCT: 50.2 % — ABNORMAL HIGH (ref 38.5–50.0)
Hemoglobin: 17.2 g/dL — ABNORMAL HIGH (ref 13.2–17.1)
Lymphs Abs: 1310 cells/uL (ref 850–3900)
MCH: 31 pg (ref 27.0–33.0)
MCHC: 34.3 g/dL (ref 32.0–36.0)
MCV: 90.6 fL (ref 80.0–100.0)
MPV: 12 fL (ref 7.5–12.5)
Monocytes Relative: 8.4 %
Neutro Abs: 8962 cells/uL — ABNORMAL HIGH (ref 1500–7800)
Neutrophils Relative %: 76.6 %
Platelets: 219 10*3/uL (ref 140–400)
RBC: 5.54 10*6/uL (ref 4.20–5.80)
RDW: 12.6 % (ref 11.0–15.0)
Total Lymphocyte: 11.2 %
WBC: 11.7 10*3/uL — ABNORMAL HIGH (ref 3.8–10.8)

## 2022-01-02 LAB — URINALYSIS W MICROSCOPIC + REFLEX CULTURE
Bacteria, UA: NONE SEEN /HPF
Bilirubin Urine: NEGATIVE
Glucose, UA: NEGATIVE
Hgb urine dipstick: NEGATIVE
Hyaline Cast: NONE SEEN /LPF
Ketones, ur: NEGATIVE
Leukocyte Esterase: NEGATIVE
Nitrites, Initial: NEGATIVE
Protein, ur: NEGATIVE
RBC / HPF: NONE SEEN /HPF (ref 0–2)
Specific Gravity, Urine: 1.019 (ref 1.001–1.035)
Squamous Epithelial / HPF: NONE SEEN /HPF (ref <=5)
WBC, UA: NONE SEEN /HPF (ref 0–5)
pH: 6.5 (ref 5.0–8.0)

## 2022-01-02 LAB — COMPREHENSIVE METABOLIC PANEL
AG Ratio: 1.7 (calc) (ref 1.0–2.5)
ALT: 36 U/L (ref 9–46)
AST: 27 U/L (ref 10–35)
Albumin: 4 g/dL (ref 3.6–5.1)
Alkaline phosphatase (APISO): 63 U/L (ref 35–144)
BUN: 11 mg/dL (ref 7–25)
CO2: 33 mmol/L — ABNORMAL HIGH (ref 20–32)
Calcium: 9.2 mg/dL (ref 8.6–10.3)
Chloride: 103 mmol/L (ref 98–110)
Creat: 1.09 mg/dL (ref 0.70–1.30)
Globulin: 2.4 g/dL (calc) (ref 1.9–3.7)
Glucose, Bld: 152 mg/dL — ABNORMAL HIGH (ref 65–99)
Potassium: 3.9 mmol/L (ref 3.5–5.3)
Sodium: 140 mmol/L (ref 135–146)
Total Bilirubin: 0.8 mg/dL (ref 0.2–1.2)
Total Protein: 6.4 g/dL (ref 6.1–8.1)

## 2022-01-02 LAB — NO CULTURE INDICATED

## 2022-01-02 LAB — AMYLASE: Amylase: 39 U/L (ref 21–101)

## 2022-01-02 LAB — LIPASE: Lipase: 32 U/L (ref 7–60)

## 2022-01-02 MED ORDER — SODIUM CHLORIDE 0.9 % IV SOLN
2.0000 g | INTRAVENOUS | Status: DC
  Administered 2022-01-02 – 2022-01-03 (×2): 2 g via INTRAVENOUS
  Filled 2022-01-02 (×3): qty 20

## 2022-01-02 MED ORDER — SIMETHICONE 80 MG PO CHEW: 40.0000 mg | CHEWABLE_TABLET | Freq: Four times a day (QID) | ORAL | Status: DC | PRN

## 2022-01-02 MED ORDER — ONDANSETRON HCL 4 MG/2ML IJ SOLN: 4.0000 mg | Freq: Four times a day (QID) | INTRAMUSCULAR | Status: DC | PRN

## 2022-01-02 MED ORDER — PANTOPRAZOLE SODIUM 40 MG IV SOLR
40.0000 mg | Freq: Every day | INTRAVENOUS | Status: DC
  Administered 2022-01-02 – 2022-01-03 (×2): 40 mg via INTRAVENOUS
  Filled 2022-01-02 (×2): qty 40

## 2022-01-02 MED ORDER — ACETAMINOPHEN 325 MG PO TABS: 650.0000 mg | ORAL_TABLET | Freq: Four times a day (QID) | ORAL | Status: DC | PRN

## 2022-01-02 MED ORDER — LACTATED RINGERS IV SOLN: INTRAVENOUS | Status: DC

## 2022-01-02 MED ORDER — ONDANSETRON 4 MG PO TBDP
4.0000 mg | ORAL_TABLET | Freq: Four times a day (QID) | ORAL | Status: DC | PRN
  Administered 2022-01-04: 4 mg via ORAL
  Filled 2022-01-02: qty 1

## 2022-01-02 MED ORDER — HYDROMORPHONE HCL 1 MG/ML IJ SOLN
1.0000 mg | INTRAMUSCULAR | Status: DC | PRN
  Administered 2022-01-02 – 2022-01-04 (×4): 1 mg via INTRAVENOUS
  Filled 2022-01-02 (×4): qty 1

## 2022-01-02 MED ORDER — ACETAMINOPHEN 650 MG RE SUPP: 650.0000 mg | Freq: Four times a day (QID) | RECTAL | Status: DC | PRN

## 2022-01-02 MED ORDER — METHOCARBAMOL 500 MG PO TABS
500.0000 mg | ORAL_TABLET | Freq: Four times a day (QID) | ORAL | Status: DC | PRN
  Administered 2022-01-02: 18:00:00 500 mg via ORAL
  Filled 2022-01-02: qty 1

## 2022-01-02 MED ORDER — ENOXAPARIN SODIUM 40 MG/0.4ML IJ SOSY
40.0000 mg | PREFILLED_SYRINGE | INTRAMUSCULAR | Status: DC
  Administered 2022-01-02 – 2022-01-03 (×2): 40 mg via SUBCUTANEOUS
  Filled 2022-01-02 (×2): qty 0.4

## 2022-01-02 MED ORDER — METOPROLOL TARTRATE 5 MG/5ML IV SOLN: 5.0000 mg | Freq: Four times a day (QID) | INTRAVENOUS | Status: DC | PRN

## 2022-01-02 MED ORDER — IOHEXOL 300 MG/ML  SOLN
100.0000 mL | Freq: Once | INTRAMUSCULAR | Status: AC | PRN
  Administered 2022-01-02: 100 mL via INTRAVENOUS

## 2022-01-02 NOTE — H&P (Signed)
Bobby Saunders 11/28/1966  [id number].    Requesting MD: Hortencia Conradihenddeekkam MD  Chief Complaint/Reason for Consult: cholecystitis   HPI:  [name] is a [age] y/o M who presented to his PCP with a cc 4 days history of RUQ abdominal pain that is sharp and non-radiating.  Denies aggravating or alleviating factors. Associated with poor PO intake.  Pain is better now.  Location is right upper quadrant of his abdomen.  Blood thinners: none Past surgeries: lumbar laminectomy/decompression, no abd surgeries  Social history: denies tobacco, alcohol, or drug use. Former smoker, quit 2006.  ROS: Review of Systems  All other systems reviewed and are negative.  Family History  Problem Relation Age of Onset   Cancer Sister        colon   Hyperlipidemia Mother    Hypertension Mother    Cancer Maternal Uncle    Cancer Maternal Grandmother    Sudden death Neg Hx    Heart attack Neg Hx    Diabetes Neg Hx     Past Medical History:  Diagnosis Date   Sleep apnea    cpap 6 yrs   Vitamin D insufficiency 01/07/2018    Past Surgical History:  Procedure Laterality Date   ELBOW SURGERY Bilateral 01 last   numerous lft , one right   LUMBAR LAMINECTOMY/DECOMPRESSION MICRODISCECTOMY N/A 05/13/2014   Procedure: LUMBAR LAMINECTOMY/DECOMPRESSION MICRODISCECTOMY;  Surgeon: Emilee HeroMark Leonard Dumonski, MD;  Location: [facility] OR;  Service: Orthopedics;  Laterality: N/A;  Lumbar 3-4 decompression    Social History:  reports that he quit smoking about 16 years ago. He has a 25.00 pack-year smoking history. He uses smokeless tobacco. He reports that he does not drink alcohol and does not use drugs.  Allergies: No Known Allergies  No medications prior to admission.     Physical Exam: There were no vitals taken for this visit. General appearance: White male no apparent distress pleasant  HEENT: No jaundice  Neck: Trachea midline no JVD  Pulmonary: Lung sounds are clear  Cardiovascular: Normal sinus  rhythm  Abdomen: Positive right upper quadrant tenderness to palpation.  No mass.  No hernia  Extremities: No evidence of lower extremity edema.  Cap refill normal.  Neuro: Alert and oriented x4 nonfocal    Results for orders placed or performed in visit on 01/01/22 (from the past 48 hour(s))  CBC with Differential/Platelet     Status: Abnormal   Collection Time: 01/01/22 12:00 AM  Result Value Ref Range   WBC 11.7 (H) 3.8 - 10.8 Thousand/uL   RBC 5.54 4.20 - 5.80 Million/uL   Hemoglobin 17.2 (H) 13.2 - 17.1 g/dL   HCT 40.950.2 (H) 81.138.5 - 91.450.0 %   MCV 90.6 80.0 - 100.0 fL   MCH 31.0 27.0 - 33.0 pg   MCHC 34.3 32.0 - 36.0 g/dL   RDW 78.212.6 95.611.0 - 21.315.0 %   Platelets 219 140 - 400 Thousand/uL   MPV 12.0 7.5 - 12.5 fL   Neutro Abs 8,962 (H) 1,500 - 7,800 cells/uL   Lymphs Abs 1,310 850 - 3,900 cells/uL   Absolute Monocytes 983 (H) 200 - 950 cells/uL   Eosinophils Absolute 386 15 - 500 cells/uL   Basophils Absolute 59 0 - 200 cells/uL   Neutrophils Relative % 76.6 %   Total Lymphocyte 11.2 %   Monocytes Relative 8.4 %   Eosinophils Relative 3.3 %   Basophils Relative 0.5 %  Comprehensive metabolic panel     Status: Abnormal  Collection Time: 01/01/22 12:00 AM  Result Value Ref Range   Glucose, Bld 152 (H) 65 - 99 mg/dL    Comment: .            Fasting reference interval . For someone without known diabetes, a glucose value >125 mg/dL indicates that they may have diabetes and this should be confirmed with a follow-up test. .    BUN 11 7 - 25 mg/dL   Creat 4.091.09 8.110.70 - 9.141.30 mg/dL   BUN/Creatinine Ratio NOT APPLICABLE 6 - 22 (calc)   Sodium 140 135 - 146 mmol/L   Potassium 3.9 3.5 - 5.3 mmol/L   Chloride 103 98 - 110 mmol/L   CO2 33 (H) 20 - 32 mmol/L   Calcium 9.2 8.6 - 10.3 mg/dL   Total Protein 6.4 6.1 - 8.1 g/dL   Albumin 4.0 3.6 - 5.1 g/dL   Globulin 2.4 1.9 - 3.7 g/dL (calc)   AG Ratio 1.7 1.0 - 2.5 (calc)   Total Bilirubin 0.8 0.2 - 1.2 mg/dL   Alkaline  phosphatase (APISO) 63 35 - 144 U/L   AST 27 10 - 35 U/L   ALT 36 9 - 46 U/L  Amylase     Status: None   Collection Time: 01/01/22 12:00 AM  Result Value Ref Range   Amylase 39 21 - 101 U/L  Lipase     Status: None   Collection Time: 01/01/22 12:00 AM  Result Value Ref Range   Lipase 32 7 - 60 U/L  Urinalysis w microscopic + reflex cultur     Status: None   Collection Time: 01/01/22 12:00 AM   Specimen: Urine  Result Value Ref Range   Color, Urine YELLOW YELLOW   APPearance CLEAR CLEAR   Specific Gravity, Urine 1.019 1.001 - 1.035   pH 6.5 5.0 - 8.0   Glucose, UA NEGATIVE NEGATIVE   Bilirubin Urine NEGATIVE NEGATIVE   Ketones, ur NEGATIVE NEGATIVE   Hgb urine dipstick NEGATIVE NEGATIVE   Protein, ur NEGATIVE NEGATIVE   Nitrites, Initial NEGATIVE NEGATIVE   Leukocyte Esterase NEGATIVE NEGATIVE   WBC, UA NONE SEEN 0 - 5 /HPF   RBC / HPF NONE SEEN 0 - 2 /HPF   Squamous Epithelial / LPF NONE SEEN < OR = 5 /HPF   Bacteria, UA NONE SEEN NONE SEEN /HPF   Hyaline Cast NONE SEEN NONE SEEN /LPF   Note      Comment: This urine was analyzed for the presence of WBC,  RBC, bacteria, casts, and other formed elements.  Only those elements seen were reported. . .   REFLEXIVE URINE CULTURE     Status: None   Collection Time: 01/01/22 12:00 AM  Result Value Ref Range   Reflexve Urine Culture      Comment: NO CULTURE INDICATED   US Abdomen Complete  Result Date: 01/02/2022 CLINICAL DATA:  Acute right upper quadrant pain for 4 days. Nausea vomiting. EXAM: ABDOMEN ULTRASOUND COMPLETE COMPARISON:  Abdomen/pelvis CT 01/02/2022. FINDINGS: Gallbladder: Gallstones evident measuring up to 17 mm. Gallbladder wall is thickened at 4-5 mm with gallbladder wall edema evident and trace pericholecystic fluid noted. Common bile duct: Diameter: 5-6 mm Liver: Echogenic liver parenchyma suggests underlying fatty deposition. Portal vein is patent on color Doppler imaging with normal direction of blood flow  towards the liver. IVC: No abnormality visualized. Pancreas: Visualized portion unremarkable. Spleen: Size and appearance within normal limits. Right Kidney: Length: 11.2 cm. Echogenicity within normal limits. No mass or  hydronephrosis visualized. Left Kidney: Length: 11.0 cm. 12 mm exophytic cyst towards the lower pole compatible with cyst seen on CT earlier today. Echogenicity within normal limits. No mass or hydronephrosis visualized. Abdominal aorta: No aneurysm visualized. Other findings: None. IMPRESSION: 1. Cholelithiasis with gallbladder wall thickening and trace pericholecystic fluid. Imaging features consistent with acute cholecystitis. 2. No biliary dilatation. 3. 12 mm exophytic cyst lower pole left kidney. These results will be called to the ordering clinician or representative by the Radiologist Assistant, and communication documented in the PACS or [employer]. Electronically Signed   By: Kennith Center M.D.   On: 01/02/2022 11:08   CT ABDOMEN PELVIS W CONTRAST  Result Date: 01/02/2022 CLINICAL DATA:  Abdominal pain, acute, nonlocalized. Acute right upper quadrant abdominal pain with guarding and rigidity for 5 days. Alternating constipation, diarrhea, nausea and vomiting. History of kidney stones. No previous relevant surgery. EXAM: CT ABDOMEN AND PELVIS WITH CONTRAST TECHNIQUE: Multidetector CT imaging of the abdomen and pelvis was performed using the standard protocol following bolus administration of intravenous contrast. RADIATION DOSE REDUCTION: This exam was performed according to the departmental dose-optimization program which includes automated exposure control, adjustment of the mA and/or kV according to patient size and/or use of iterative reconstruction technique. CONTRAST:  OMNIPAQUE IOHEXOL 300 MG/ML  SOLN COMPARISON:  Lumbar MRI 03/27/2019 FINDINGS: Lower chest: Minimal dependent atelectasis at both lung bases. The lung bases are otherwise clear. No significant pleural or  pericardial effusion. Hepatobiliary: The liver is normal in density without suspicious focal abnormality. There are at least 2 partially calcified gallstones. The gallbladder is distended with wall thickening and surrounding inflammatory changes, highly suspicious for acute cholecystitis. No significant biliary dilatation. Pancreas: Unremarkable. No pancreatic ductal dilatation or surrounding inflammatory changes. Spleen: Normal in size without focal abnormality. Adrenals/Urinary Tract: Both adrenal glands appear normal. There is a small exophytic cyst in the lower pole of the left kidney. Both kidneys otherwise appear normal. No evidence of urinary tract calculus, hydronephrosis or perinephric soft tissue stranding. The bladder appears unremarkable. Stomach/Bowel: Enteric contrast was administered and has passed into the distal colon. The stomach appears unremarkable for its degree of distension. No evidence of bowel wall thickening, distention or surrounding inflammatory change. Small retrocecal appendix appears normal. Minimal diverticular changes in the sigmoid colon. Vascular/Lymphatic: There are no enlarged abdominal or pelvic lymph nodes. Small lymph nodes in the porta hepatis, likely reactive. No significant vascular findings. Reproductive: The prostate gland demonstrates central dystrophic calcifications. The seminal vesicles appear unremarkable. Other: No evidence of abdominal wall mass or hernia. No ascites. Musculoskeletal: No acute or significant osseous findings. Status post posterior decompression at L3-4. Posterolateral osseous excrescence from the right iliac bone appears unchanged from previous MRI and may reflect an osteochondroma or sequela of prior bone grafting. No associated soft tissue mass or surrounding inflammation. IMPRESSION: 1. Cholelithiasis with gallbladder wall thickening, surrounding inflammation and gallbladder distension, highly suspicious for acute cholecystitis. No significant  biliary dilatation. 2. No other acute abdominal findings. 3. Postsurgical changes in the lumbar spine with grossly stable osseous excrescence from the right iliac bone. Electronically Signed   By: Carey Bullocks M.D.   On: 01/02/2022 10:34    Assessment/Plan Acute calculous cholecystitis  - afebrile, WBC yesterday was 11.7, LFTs WNL, lipase WNL  - RUQ U/S with cholelithiasis, wallthickening, pericholecystitis fluid. No biliary dilation - CT Abd/pelvis with inflammatory changes around the gallbladder, gallstones, no other acute abnormalities - recommend laparoscopic cholecystectomy    FEN - NPO, IVF  VTE - SCD's ID - Rocephin continue antibiotics  Admit - to [facility] service, observation  Plan for laparoscopic cholecystectomy in a.m.  Discussed risks and benefits of surgery  The procedure has been discussed with the patient. Operative and non operative treatments have been discussed. Risks of surgery include bleeding, infection,  Common bile duct injury,  Injury to the stomach,liver, colon,small intestine, abdominal wall,  Diaphragm,  Major blood vessels,  And the need for an open procedure.  Other risks include worsening of medical problems, death,  DVT and pulmonary embolism, and cardiovascular events.   Medical options have also been discussed. The patient has been informed of long term expectations of surgery and non surgical options,  The patient agrees to proceed.     Moderate Medical Decision Making Total time 1 hour face-to-face, discussion of surgery, discussion of complications, chart review, medical record review, examination, documentation Harriette Bouillon MD [facility] 01/02/2022, 1:49 PM Please see Amion for pager number during day hours 7:00am-4:30pm or 7:00am -11:30am on weekends

## 2022-01-02 NOTE — Progress Notes (Signed)
Patient arrived to 1311 via wheelchair with wife at bedside.  Aox4. VSS.  MD Cornett notified of pts arrival.  Orders in chart.

## 2022-01-03 ENCOUNTER — Other Ambulatory Visit: Payer: Self-pay

## 2022-01-03 ENCOUNTER — Observation Stay (HOSPITAL_COMMUNITY): Payer: BC Managed Care – PPO | Admitting: Anesthesiology

## 2022-01-03 ENCOUNTER — Encounter (HOSPITAL_COMMUNITY): Payer: Self-pay

## 2022-01-03 ENCOUNTER — Encounter (HOSPITAL_COMMUNITY): Admission: AD | Disposition: A | Discharge status: Home / Self Care | Payer: Self-pay | Source: Ambulatory Visit

## 2022-01-03 HISTORY — PX: CHOLECYSTECTOMY: SHX55

## 2022-01-03 LAB — COMPREHENSIVE METABOLIC PANEL
ALT: 79 U/L — ABNORMAL HIGH (ref 0–44)
AST: 73 U/L — ABNORMAL HIGH (ref 15–41)
Albumin: 3.5 g/dL (ref 3.5–5.0)
Alkaline Phosphatase: 66 U/L (ref 38–126)
Anion gap: 9 (ref 5–15)
BUN: 13 mg/dL (ref 6–20)
CO2: 24 mmol/L (ref 22–32)
Calcium: 8.8 mg/dL — ABNORMAL LOW (ref 8.9–10.3)
Chloride: 102 mmol/L (ref 98–111)
Creatinine, Ser: 0.99 mg/dL (ref 0.61–1.24)
GFR, Estimated: >60 mL/min (ref >60)
Glucose, Bld: 111 mg/dL — ABNORMAL HIGH (ref 70–99)
Potassium: 4 mmol/L (ref 3.5–5.1)
Sodium: 135 mmol/L (ref 135–145)
Total Bilirubin: 1.1 mg/dL (ref 0.3–1.2)
Total Protein: 6.8 g/dL (ref 6.5–8.1)

## 2022-01-03 LAB — CBC
HCT: 46.8 % (ref 39.0–52.0)
Hemoglobin: 16 g/dL (ref 13.0–17.0)
MCH: 31.4 pg (ref 26.0–34.0)
MCHC: 34.2 g/dL (ref 30.0–36.0)
MCV: 91.9 fL (ref 80.0–100.0)
Platelets: 205 10*3/uL (ref 150–400)
RBC: 5.09 MIL/uL (ref 4.22–5.81)
RDW: 13.1 % (ref 11.5–15.5)
WBC: 10.1 10*3/uL (ref 4.0–10.5)
nRBC: 0 % (ref 0.0–0.2)

## 2022-01-03 LAB — SURGICAL PCR SCREEN
MRSA, PCR: NEGATIVE
Staphylococcus aureus: NEGATIVE

## 2022-01-03 LAB — RESP PANEL BY RT-PCR (FLU A&B, COVID) ARPGX2
Influenza A by PCR: NEGATIVE
Influenza B by PCR: NEGATIVE
SARS Coronavirus 2 by RT PCR: NEGATIVE

## 2022-01-03 LAB — HIV ANTIBODY (ROUTINE TESTING W REFLEX): HIV Screen 4th Generation wRfx: NONREACTIVE

## 2022-01-03 LAB — GLUCOSE, CAPILLARY: Glucose-Capillary: 78 mg/dL (ref 70–99)

## 2022-01-03 SURGERY — LAPAROSCOPIC CHOLECYSTECTOMY WITH INTRAOPERATIVE CHOLANGIOGRAM
Anesthesia: General

## 2022-01-03 MED ORDER — ROCURONIUM BROMIDE 10 MG/ML (PF) SYRINGE
PREFILLED_SYRINGE | INTRAVENOUS | Status: AC
  Filled 2022-01-03: qty 10

## 2022-01-03 MED ORDER — ACETAMINOPHEN 10 MG/ML IV SOLN
INTRAVENOUS | Status: AC
  Filled 2022-01-03: qty 100

## 2022-01-03 MED ORDER — ONDANSETRON HCL 4 MG/2ML IJ SOLN
INTRAMUSCULAR | Status: AC
  Filled 2022-01-03: qty 2

## 2022-01-03 MED ORDER — RINGERS IRRIGATION IR SOLN
Status: DC | PRN
  Administered 2022-01-03: 1

## 2022-01-03 MED ORDER — PROPOFOL 10 MG/ML IV BOLUS
INTRAVENOUS | Status: AC
  Filled 2022-01-03: qty 20

## 2022-01-03 MED ORDER — SUGAMMADEX SODIUM 500 MG/5ML IV SOLN
INTRAVENOUS | Status: AC
  Filled 2022-01-03: qty 5

## 2022-01-03 MED ORDER — DEXAMETHASONE SODIUM PHOSPHATE 10 MG/ML IJ SOLN
INTRAMUSCULAR | Status: DC | PRN
  Administered 2022-01-03: 5 mg via INTRAVENOUS

## 2022-01-03 MED ORDER — MIDAZOLAM HCL 2 MG/2ML IJ SOLN
INTRAMUSCULAR | Status: DC | PRN
  Administered 2022-01-03: 2 mg via INTRAVENOUS

## 2022-01-03 MED ORDER — BUPIVACAINE-EPINEPHRINE 0.25% -1:200000 IJ SOLN
INTRAMUSCULAR | Status: DC | PRN
  Administered 2022-01-03: 30 mL

## 2022-01-03 MED ORDER — ACETAMINOPHEN 10 MG/ML IV SOLN
1000.0000 mg | Freq: Once | INTRAVENOUS | Status: DC | PRN
  Administered 2022-01-03: 1000 mg via INTRAVENOUS

## 2022-01-03 MED ORDER — HYDROMORPHONE HCL 2 MG/ML IJ SOLN
INTRAMUSCULAR | Status: AC
  Filled 2022-01-03: qty 1

## 2022-01-03 MED ORDER — LIDOCAINE HCL (PF) 2 % IJ SOLN
INTRAMUSCULAR | Status: AC
  Filled 2022-01-03: qty 5

## 2022-01-03 MED ORDER — SUGAMMADEX SODIUM 500 MG/5ML IV SOLN
INTRAVENOUS | Status: DC | PRN
  Administered 2022-01-03: 300 mg via INTRAVENOUS

## 2022-01-03 MED ORDER — PROPOFOL 10 MG/ML IV BOLUS
INTRAVENOUS | Status: DC | PRN
  Administered 2022-01-03: 200 mg via INTRAVENOUS

## 2022-01-03 MED ORDER — BUPIVACAINE-EPINEPHRINE (PF) 0.25% -1:200000 IJ SOLN
INTRAMUSCULAR | Status: AC
  Filled 2022-01-03: qty 30

## 2022-01-03 MED ORDER — FENTANYL CITRATE (PF) 250 MCG/5ML IJ SOLN
INTRAMUSCULAR | Status: DC | PRN
  Administered 2022-01-03: 100 ug via INTRAVENOUS
  Administered 2022-01-03: 150 ug via INTRAVENOUS

## 2022-01-03 MED ORDER — OXYCODONE HCL 5 MG PO TABS
5.0000 mg | ORAL_TABLET | ORAL | Status: DC | PRN
  Administered 2022-01-03: 5 mg via ORAL
  Administered 2022-01-04: 10 mg via ORAL
  Filled 2022-01-03: qty 1
  Filled 2022-01-03: qty 2

## 2022-01-03 MED ORDER — SODIUM CHLORIDE (PF) 0.9 % IJ SOLN
INTRAMUSCULAR | Status: AC
  Filled 2022-01-03: qty 10

## 2022-01-03 MED ORDER — PREGABALIN 75 MG PO CAPS: 75.0000 mg | ORAL_CAPSULE | Freq: Three times a day (TID) | ORAL | Status: DC | PRN

## 2022-01-03 MED ORDER — MIDAZOLAM HCL 2 MG/2ML IJ SOLN
INTRAMUSCULAR | Status: AC
  Filled 2022-01-03: qty 2

## 2022-01-03 MED ORDER — DEXAMETHASONE SODIUM PHOSPHATE 10 MG/ML IJ SOLN
INTRAMUSCULAR | Status: AC
  Filled 2022-01-03: qty 1

## 2022-01-03 MED ORDER — ONDANSETRON HCL 4 MG/2ML IJ SOLN
INTRAMUSCULAR | Status: DC | PRN
  Administered 2022-01-03: 4 mg via INTRAVENOUS

## 2022-01-03 MED ORDER — FENTANYL CITRATE PF 50 MCG/ML IJ SOSY
25.0000 ug | PREFILLED_SYRINGE | INTRAMUSCULAR | Status: DC | PRN
  Administered 2022-01-03 (×2): 50 ug via INTRAVENOUS

## 2022-01-03 MED ORDER — PROMETHAZINE HCL 25 MG/ML IJ SOLN: 6.2500 mg | INTRAMUSCULAR | Status: DC | PRN

## 2022-01-03 MED ORDER — LIDOCAINE 2% (20 MG/ML) 5 ML SYRINGE
INTRAMUSCULAR | Status: DC | PRN
Start: 2022-01-03 — End: 2022-01-03
  Administered 2022-01-03: 80 mg via INTRAVENOUS

## 2022-01-03 MED ORDER — HYDROMORPHONE HCL 1 MG/ML IJ SOLN
INTRAMUSCULAR | Status: DC | PRN
  Administered 2022-01-03 (×5): .4 mg via INTRAVENOUS

## 2022-01-03 MED ORDER — ROCURONIUM BROMIDE 10 MG/ML (PF) SYRINGE
PREFILLED_SYRINGE | INTRAVENOUS | Status: DC | PRN
  Administered 2022-01-03: 70 mg via INTRAVENOUS
  Administered 2022-01-03: 30 mg via INTRAVENOUS

## 2022-01-03 MED ORDER — SODIUM CHLORIDE 0.9 % IV SOLN
INTRAVENOUS | Status: AC
  Administered 2022-01-03: 2 g via INTRAVENOUS
  Filled 2022-01-03: qty 20

## 2022-01-03 MED ORDER — FENTANYL CITRATE (PF) 250 MCG/5ML IJ SOLN
INTRAMUSCULAR | Status: AC
  Filled 2022-01-03: qty 5

## 2022-01-03 MED ORDER — FENTANYL CITRATE PF 50 MCG/ML IJ SOSY
PREFILLED_SYRINGE | INTRAMUSCULAR | Status: AC
  Administered 2022-01-03: 50 ug via INTRAVENOUS
  Filled 2022-01-03: qty 3

## 2022-01-03 SURGICAL SUPPLY — 38 items
APPLIER CLIP ROT 10 11.4 M/L (STAPLE) ×2
CHLORAPREP W/TINT 26 (MISCELLANEOUS) ×2 IMPLANT
CLIP APPLIE ROT 10 11.4 M/L (STAPLE) ×1 IMPLANT
DERMABOND ADVANCED (GAUZE/BANDAGES/DRESSINGS)
DERMABOND ADVANCED .7 DNX12 (GAUZE/BANDAGES/DRESSINGS) IMPLANT
DRAIN CHANNEL 19F RND (DRAIN) ×1 IMPLANT
DRAPE C-ARM 42X120 X-RAY (DRAPES) ×2 IMPLANT
DRAPE SHEET LG 3/4 BI-LAMINATE (DRAPES) ×2 IMPLANT
DRAPE WARM FLUID 44X44 (DRAPES) IMPLANT
ELECT REM PT RETURN 15FT ADLT (MISCELLANEOUS) ×2 IMPLANT
ENDOLOOP SUT PDS II  0 18 (SUTURE) ×1
ENDOLOOP SUT PDS II 0 18 (SUTURE) IMPLANT
EVACUATOR SILICONE 100CC (DRAIN) ×1 IMPLANT
GLOVE SURG MICRO LTX SZ7.5 (GLOVE) IMPLANT
GLOVE SURG UNDER LTX SZ8 (GLOVE) IMPLANT
GOWN STRL REUS W/TWL XL LVL3 (GOWN DISPOSABLE) ×4 IMPLANT
HEMOSTAT SNOW SURGICEL 2X4 (HEMOSTASIS) ×1 IMPLANT
HEMOSTAT SURGICEL 4X8 (HEMOSTASIS) IMPLANT
IRRIG SUCT STRYKERFLOW 2 WTIP (MISCELLANEOUS) ×2
IRRIGATION SUCT STRKRFLW 2 WTP (MISCELLANEOUS) ×1 IMPLANT
KIT BASIN OR (CUSTOM PROCEDURE TRAY) ×2 IMPLANT
KIT TURNOVER KIT A (KITS) IMPLANT
POUCH SPECIMEN RETRIEVAL 10MM (ENDOMECHANICALS) ×2 IMPLANT
SCISSORS LAP 5X35 DISP (ENDOMECHANICALS) IMPLANT
SET CHOLANGIOGRAPH MIX (MISCELLANEOUS) ×2 IMPLANT
SET TUBE SMOKE EVAC HIGH FLOW (TUBING) IMPLANT
SLEEVE XCEL OPT CAN 5 100 (ENDOMECHANICALS) ×2 IMPLANT
SPIKE FLUID TRANSFER (MISCELLANEOUS) ×2 IMPLANT
SPONGE DRAIN TRACH 4X4 STRL 2S (GAUZE/BANDAGES/DRESSINGS) ×1 IMPLANT
SUT ETHILON 2 0 PS N (SUTURE) ×1 IMPLANT
SUT MNCRL AB 4-0 PS2 18 (SUTURE) ×2 IMPLANT
SUT VICRYL 0 UR6 27IN ABS (SUTURE) ×1 IMPLANT
TOWEL OR 17X26 10 PK STRL BLUE (TOWEL DISPOSABLE) ×2 IMPLANT
TOWEL OR NON WOVEN STRL DISP B (DISPOSABLE) ×2 IMPLANT
TRAY LAPAROSCOPIC (CUSTOM PROCEDURE TRAY) ×2 IMPLANT
TROCAR BLADELESS OPT 5 100 (ENDOMECHANICALS) ×2 IMPLANT
TROCAR XCEL BLUNT TIP 100MML (ENDOMECHANICALS) ×2 IMPLANT
TROCAR XCEL NON-BLD 11X100MML (ENDOMECHANICALS) ×2 IMPLANT

## 2022-01-03 NOTE — Progress Notes (Signed)
Bobby Saunders has acute cholecystitis on history, physical and imaging.  I recommended laparoscopic cholecystectomy.  The procedure itself as well as its risks, benefits and alteratives were discussed and the patient granted consent to proceed.  We will proceed as scheduled.  Quentin Ore, MD General, Bariatric and Minimally Invasive Surgery [facility] Surgery, [place]

## 2022-01-03 NOTE — Anesthesia Postprocedure Evaluation (Signed)
Anesthesia Post Note  Patient: Bobby Saunders  Procedure(s) Performed: LAPAROSCOPIC CHOLECYSTECTOMY     Patient location during evaluation: PACU Anesthesia Type: General Level of consciousness: awake and alert Pain management: pain level controlled Vital Signs Assessment: post-procedure vital signs reviewed and stable Respiratory status: spontaneous breathing, nonlabored ventilation, respiratory function stable and patient connected to nasal cannula oxygen Cardiovascular status: blood pressure returned to baseline and stable Postop Assessment: no apparent nausea or vomiting Anesthetic complications: no   No notable events documented.  Last Vitals:  Vitals:   01/03/22 1657 01/03/22 1807  BP: (!) 144/82 (!) 143/94  Pulse: 79 79  Resp:    Temp: (!) 36.4 C 36.6 C  SpO2: 93% 94%    Last Pain:  Vitals:   01/03/22 1807  TempSrc: Oral  PainSc:                  March Rummage Zakkiyya Barno

## 2022-01-03 NOTE — Anesthesia Preprocedure Evaluation (Addendum)
Anesthesia Evaluation  Patient identified by MRN, date of birth, ID band Patient awake    Reviewed: Allergy & Precautions, NPO status , Patient's Chart, lab work & pertinent test results  Airway Mallampati: II  TM Distance: >3 FB Neck ROM: Full    Dental no notable dental hx.    Pulmonary sleep apnea and Continuous Positive Airway Pressure Ventilation , former smoker,    Pulmonary exam normal        Cardiovascular negative cardio ROS   Rhythm:Regular Rate:Normal     Neuro/Psych negative neurological ROS  negative psych ROS   GI/Hepatic Neg liver ROS, GERD  Medicated,Cholecystitis    Endo/Other  diabetes, Type 2, Oral Hypoglycemic Agents  Renal/GU negative Renal ROS  negative genitourinary   Musculoskeletal  (+) Arthritis , Osteoarthritis,    Abdominal Normal abdominal exam  (+)   Peds  Hematology   Anesthesia Other Findings   Reproductive/Obstetrics                             Anesthesia Physical Anesthesia Plan  ASA: 2  Anesthesia Plan: General   Post-op Pain Management:    Induction: Intravenous  PONV Risk Score and Plan: 2 and Ondansetron, Dexamethasone, Midazolam and Treatment may vary due to age or medical condition  Airway Management Planned: Mask and Oral ETT  Additional Equipment: None  Intra-op Plan:   Post-operative Plan: Extubation in OR  Informed Consent: I have reviewed the patients History and Physical, chart, labs and discussed the procedure including the risks, benefits and alternatives for the proposed anesthesia with the patient or authorized representative who has indicated his/her understanding and acceptance.     Dental advisory given  Plan Discussed with: CRNA  Anesthesia Plan Comments:         Anesthesia Quick Evaluation

## 2022-01-03 NOTE — Op Note (Signed)
Patient: Bobby Saunders (02-Sep-1966, [id number])  Date of Surgery: 01/02/2022 - 01/03/2022   Preoperative Diagnosis: CHOLECYSTITIS   Postoperative Diagnosis: CHOLECYSTITIS   Surgical Procedure: LAPAROSCOPIC CHOLECYSTECTOMY: [id number] (CPT)   Operative Team Members:  Surgeon(s) and Role:    * Steadman Prosperi, Nickola Major, MD - Primary   Anesthesiologist: Darral Dash, DO CRNA: Lollie Sails, CRNA   Anesthesia: General   Fluids:  Total I/O In: [id number] [IV Piggyback:1690] Out: 400 [id number]  Complications: * No complications entered in OR log *  Drains:  (19 Fr) Jackson-Pratt drain(s) with closed bulb suction in the gallbladder fossa exiting the right upper quadrant    Specimen:  ID Type Source Tests Collected by Time Destination  1 : gallbladder Tissue PATH Gallbladder SURGICAL PATHOLOGY Tola Meas, Nickola Major, MD 01/03/2022 1410      Disposition:  PACU - hemodynamically stable.  Plan of Care:  continue care on med surg floor    Indications for Procedure: Bobby Saunders is a 56 y.o. male who presented with abdominal pain.  History, physical and imaging was concerning for cholecystitis.  Laparoscopic cholecystectomy was recommended for the patient.  The procedure itself, as well as the risks, benefits and alternatives were discussed with the patient.  Risks discussed included but were not limited to the risk of infection, bleeding, damage to nearby structures, need to convert to open procedure, incisional hernia, bile leak, common bile duct injury and the need for additional procedures or surgeries.  With this discussion complete and all questions answered the patient granted consent to proceed.  Findings: Necrotic gallbladder  Infection status: Patient: Bobby Saunders Emergency General Surgery Service Patient Case: Urgent Infection Present At Time Of Surgery (PATOS):  Necrotic gallbladder, spillage of bile during the case   Description of Procedure:   On the date stated  above, the patient was taken to the operating room suite and placed in supine positioning.  Sequential compression devices were placed on the lower extremities to prevent blood clots.  General endotracheal anesthesia was induced. Preoperative antibiotics were given.  The patient's abdomen was prepped and draped in the usual sterile fashion.  A time-out was completed verifying the correct patient, procedure, positioning and equipment needed for the case.  We began by anesthetizing the skin with local anesthetic and then making a 5 mm incision just below the umbilicus.  We dissected through the subcutaneous tissues to the fascia.  The fascia was grasped and elevated using a Kocher clamp.  A Veress needle was inserted into the abdomen and the abdomen was insufflated to 15 mmHg.  A 5 mm trocar was inserted in this position under optical guidance and then the abdomen was inspected.  There was no trauma to the underlying viscera with initial trocar placement.  Any abnormal findings, other than inflammation in the right upper quadrant, are listed above in the findings section.  Three additional trocars were placed, one 12 mm trocar in the subxiphoid position, one 5 mm trocar in the midline epigastric area and one 61mm trocar in the right upper quadrant subcostally.  These were placed under direct vision without any trauma to the underlying viscera.    The patient was then placed in head up, left side down positioning.  The gallbladder was identified.  It was distended and unable to be grabbed.  A needle was placed in the gallbladder to decompress it, however not much was able to be suctioned out and it remained difficult to retract.  The gallbladder appeared necrotic.  It was dissected free from its attachments to the omentum allowing the duodenum to fall away.  The infundibulum of the gallbladder was dissected free working laterally to medially.  The cystic duct and cystic artery were dissected free from surrounding  connective tissue.  The infundibulum of the gallbladder was dissected off the cystic plate.  A critical view of safety was obtained with the cystic duct and cystic artery being cleared of connective tissues and clearly the only two structures entering into the gallbladder with the liver clearly visible behind.  Clips were then applied to the cystic duct and cystic artery and then these structures were divided. The cystic duct appeared necrotic.  A PDS endoloop was placed around the cystic duct stump. The gallbladder was dissected off the cystic plate, placed in an endocatch bag and removed from the 12 mm subxiphoid port site.   The subxiphoid port site had to be enlarged to remove the large gallbladder. The clips were inspected and appeared effective.  The cystic plate was inspected and hemostasis was obtained using electrocautery.  A suction irrigator was used to clean the operative field.  Snow topical hemostatic was placed in the gallbladder fossa.  A 19 Fr JP drain was placed in the gallbladder fossa and brought out the right upper quadrant.  Attention was turned to closure.  The 12 mm subxiphoid port site was closed using running 0-vicryl suture.  The abdomen was desufflated.  The skin was closed using 4-0 monocryl and dermabond.  All sponge and needle counts were correct at the conclusion of the case.    Bobby Raw, MD General, Bariatric, & Minimally Invasive Surgery [facility] Surgery, [place]

## 2022-01-03 NOTE — Anesthesia Procedure Notes (Signed)
Procedure Name: Intubation Date/Time: 01/03/2022 1:42 PM Performed by: Lollie Sails, CRNA Pre-anesthesia Checklist: Patient identified, Emergency Drugs available, Suction available, Patient being monitored and Timeout performed Patient Re-evaluated:Patient Re-evaluated prior to induction Oxygen Delivery Method: Circle system utilized Preoxygenation: Pre-oxygenation with 100% oxygen Induction Type: IV induction Ventilation: Two handed mask ventilation required Laryngoscope Size: Miller and 3 Grade View: Grade II Tube type: Oral Tube size: 7.5 mm Number of attempts: 1 Airway Equipment and Method: Stylet Placement Confirmation: ETT inserted through vocal cords under direct vision, positive ETCO2 and breath sounds checked- equal and bilateral Secured at: 23 cm Tube secured with: Tape Dental Injury: Teeth and Oropharynx as per pre-operative assessment

## 2022-01-03 NOTE — Progress Notes (Signed)
Transition of Care [facility]) Screening Note  Patient Details  Name: Bobby Saunders Date of Birth: September 29, 1966  Transition of Care [facility]) CM/SW Contact:    Ewing Schlein, LCSW Phone Number: 01/03/2022, 10:34 AM  Transition of Care Department [facility]) has reviewed patient and no TOC needs have been identified at this time. We will continue to monitor patient advancement through interdisciplinary progression rounds. If new patient transition needs arise, please place a TOC consult.

## 2022-01-03 NOTE — Transfer of Care (Signed)
Immediate Anesthesia Transfer of Care Note  Patient: Bobby Saunders  Procedure(s) Performed: LAPAROSCOPIC CHOLECYSTECTOMY  Patient Location: PACU  Anesthesia Type:General  Level of Consciousness: awake, alert  and patient cooperative  Airway & Oxygen Therapy: Patient Spontanous Breathing and Patient connected to face mask oxygen  Post-op Assessment: Report given to RN and Post -op Vital signs reviewed and stable  Post vital signs: Reviewed and stable  Last Vitals:  Vitals Value Taken Time  BP 159/89 01/03/22 1530  Temp    Pulse 87 01/03/22 1531  Resp 21 01/03/22 1531  SpO2 100 % 01/03/22 1531  Vitals shown include unvalidated device data.  Last Pain:  Vitals:   01/03/22 1036  TempSrc: Oral  PainSc:       Patients Stated Pain Goal: 2 ([id number])  Complications: No notable events documented.

## 2022-01-03 NOTE — Discharge Instructions (Signed)
[facility] LAPAROSCOPIC SURGERY: POST OP INSTRUCTIONS Always review your discharge instruction sheet given to you by the facility where your surgery was performed. IF YOU HAVE DISABILITY OR FAMILY LEAVE FORMS, YOU MUST BRING THEM TO THE OFFICE FOR PROCESSING.   DO NOT GIVE THEM TO YOUR DOCTOR.  PAIN CONTROL  First take acetaminophen (Tylenol) AND/or ibuprofen (Advil) to control your pain after surgery.  Follow directions on package.  Taking acetaminophen (Tylenol) and/or ibuprofen (Advil) regularly after surgery will help to control your pain and lower the amount of prescription pain medication you may need.  You should not take more than 3,000 mg (3 grams) of acetaminophen (Tylenol) in 24 hours.  You should not take ibuprofen (Advil), aleve, motrin, naprosyn or other NSAIDS if you have a history of stomach ulcers or chronic kidney disease.  A prescription for pain medication may be given to you upon discharge.  Take your pain medication as prescribed, if you still have uncontrolled pain after taking acetaminophen (Tylenol) or ibuprofen (Advil). Use ice packs to help control pain. If you need a refill on your pain medication, please contact your pharmacy.  They will contact our office to request authorization. Prescriptions will not be filled after 5pm or on week-ends.  HOME MEDICATIONS Take your usually prescribed medications unless otherwise directed.  DIET You should follow a light diet the first few days after arrival home.  Be sure to include lots of fluids daily. Avoid fatty, fried foods.   CONSTIPATION It is common to experience some constipation after surgery and if you are taking pain medication.  Increasing fluid intake and taking a stool softener (such as Colace) will usually help or prevent this problem from occurring.  A mild laxative (Milk of Magnesia or Miralax) should be taken according to package instructions if there are no bowel movements after 48  hours.  WOUND/INCISION CARE Most patients will experience some swelling and bruising in the area of the incisions.  Ice packs will help.  Swelling and bruising can take several days to resolve.  Unless discharge instructions indicate otherwise, follow guidelines below  STERI-STRIPS - you may remove your outer bandages 48 hours after surgery, and you may shower at that time.  You have steri-strips (small skin tapes) in place directly over the incision.  These strips should be left on the skin for 7-10 days.   DERMABOND/SKIN GLUE - you may shower in 24 hours.  The glue will flake off over the next 2-3 weeks. Any sutures or staples will be removed at the office during your follow-up visit.  ACTIVITIES You may resume regular (light) daily activities beginning the next day--such as daily self-care, walking, climbing stairs--gradually increasing activities as tolerated.  You may have sexual intercourse when it is comfortable.  Refrain from any heavy lifting or straining until approved by your doctor. You may drive when you are no longer taking prescription pain medication, you can comfortably wear a seatbelt, and you can safely maneuver your car and apply brakes.  FOLLOW-UP You should see your doctor in the office for a follow-up appointment approximately 2-3 weeks after your surgery.  You should have been given your post-op/follow-up appointment when your surgery was scheduled.  If you did not receive a post-op/follow-up appointment, make sure that you call for this appointment within a day or two after you arrive home to insure a convenient appointment time.   WHEN TO CALL YOUR DOCTOR: Fever over 101.0 Inability to urinate Continued bleeding from incision.   Increased pain, redness, or drainage from the incision. Increasing abdominal pain  The clinic staff is available to answer your questions during regular business hours.  Please don't hesitate to call and ask to speak to one of the nurses for  clinical concerns.  If you have a medical emergency, go to the nearest emergency room or call 911.  A surgeon from [facility] is always on call at the hospital. [address], [address], [place], [place]  [address] ? [address] [address], [place], [place]   [address] [phone] ? [phone] ? FAX [phone] Web site: [email or web address]      Managing Your Pain After Surgery Without Opioids    Thank you for participating in our program to help patients manage their pain after surgery without opioids. This is part of our effort to provide you with the best care possible, without exposing you or your family to the risk that opioids pose.  What pain can I expect after surgery? You can expect to have some pain after surgery. This is normal. The pain is typically worse the day after surgery, and quickly begins to get better. Many studies have found that many patients are able to manage their pain after surgery with Over-the-Counter (OTC) medications such as Tylenol and Motrin. If you have a condition that does not allow you to take Tylenol or Motrin, notify your surgical team.  How will I manage my pain? The best strategy for controlling your pain after surgery is around the clock pain control with Tylenol (acetaminophen) and Motrin (ibuprofen or Advil). Alternating these medications with each other allows you to maximize your pain control. In addition to Tylenol and Motrin, you can use heating pads or ice packs on your incisions to help reduce your pain.  How will I alternate your regular strength over-the-counter pain medication? You will take a dose of pain medication every three hours. Start by taking 650 mg of Tylenol (2 pills of 325 mg) 3 hours later take 600 mg of Motrin (3 pills of 200 mg) 3 hours after taking the Motrin take 650 mg of Tylenol 3 hours after that take 600 mg of Motrin.   - 1 -  See example - if your first dose of Tylenol is at 12:00  PM   12:00 PM Tylenol 650 mg (2 pills of 325 mg)  3:00 PM Motrin 600 mg (3 pills of 200 mg)  6:00 PM Tylenol 650 mg (2 pills of 325 mg)  9:00 PM Motrin 600 mg (3 pills of 200 mg)  Continue alternating every 3 hours   We recommend that you follow this schedule around-the-clock for at least 3 days after surgery, or until you feel that it is no longer needed. Use the table on the last page of this handout to keep track of the medications you are taking. Important: Do not take more than 3000mg of Tylenol or 3200mg of Motrin in a 24-hour period. Do not take ibuprofen/Motrin if you have a history of bleeding stomach ulcers, severe kidney disease, &/or actively taking a blood thinner  What if I still have pain? If you have pain that is not controlled with the over-the-counter pain medications (Tylenol and Motrin or Advil) you might have what we call "breakthrough" pain. You will receive a prescription for a small amount of an opioid pain medication such as Oxycodone, Tramadol, or Tylenol with Codeine. Use these opioid pills in the first 24 hours after surgery if you have breakthrough pain. Do   not take more than 1 pill every 4-6 hours.  If you still have uncontrolled pain after using all opioid pills, don't hesitate to call our staff using the number provided. We will help make sure you are managing your pain in the best way possible, and if necessary, we can provide a prescription for additional pain medication.   Day 1    Time  Name of Medication Number of pills taken  Amount of Acetaminophen  Pain Level   Comments  AM PM       AM PM       AM PM       AM PM       AM PM       AM PM       AM PM       AM PM       Total Daily amount of Acetaminophen Do not take more than  3,000 mg per day      Day 2    Time  Name of Medication Number of pills taken  Amount of Acetaminophen  Pain Level   Comments  AM PM       AM PM       AM PM       AM PM       AM PM       AM PM       AM  PM       AM PM       Total Daily amount of Acetaminophen Do not take more than  3,000 mg per day      Day 3    Time  Name of Medication Number of pills taken  Amount of Acetaminophen  Pain Level   Comments  AM PM       AM PM       AM PM       AM PM          AM PM       AM PM       AM PM       AM PM       Total Daily amount of Acetaminophen Do not take more than  3,000 mg per day      Day 4    Time  Name of Medication Number of pills taken  Amount of Acetaminophen  Pain Level   Comments  AM PM       AM PM       AM PM       AM PM       AM PM       AM PM       AM PM       AM PM       Total Daily amount of Acetaminophen Do not take more than  3,000 mg per day      Day 5    Time  Name of Medication Number of pills taken  Amount of Acetaminophen  Pain Level   Comments  AM PM       AM PM       AM PM       AM PM       AM PM       AM PM       AM PM       AM PM       Total Daily amount of Acetaminophen Do not take more than    3,000 mg per day       Day 6    Time  Name of Medication Number of pills taken  Amount of Acetaminophen  Pain Level  Comments  AM PM       AM PM       AM PM       AM PM       AM PM       AM PM       AM PM       AM PM       Total Daily amount of Acetaminophen Do not take more than  3,000 mg per day      Day 7    Time  Name of Medication Number of pills taken  Amount of Acetaminophen  Pain Level   Comments  AM PM       AM PM       AM PM       AM PM       AM PM       AM PM       AM PM       AM PM       Total Daily amount of Acetaminophen Do not take more than  3,000 mg per day        For additional information about how and where to safely dispose of unused opioid medications - https://www.morepowerfulnc.org  Disclaimer: This document contains information and/or instructional materials adapted from [facility] for the typical patient with your condition. It does not replace medical advice  from your health care provider because your experience may differ from that of the typical patient. Talk to your health care provider if you have any questions about this document, your condition or your treatment plan. Adapted from [facility]  

## 2022-01-04 ENCOUNTER — Encounter (HOSPITAL_COMMUNITY): Payer: Self-pay | Admitting: Surgery

## 2022-01-04 DIAGNOSIS — Z20822 Contact with and (suspected) exposure to covid-19: Secondary | ICD-10-CM | POA: Diagnosis present

## 2022-01-04 DIAGNOSIS — Z8249 Family history of ischemic heart disease and other diseases of the circulatory system: Secondary | ICD-10-CM | POA: Diagnosis not present

## 2022-01-04 DIAGNOSIS — K8012 Calculus of gallbladder with acute and chronic cholecystitis without obstruction: Secondary | ICD-10-CM | POA: Diagnosis present

## 2022-01-04 DIAGNOSIS — E559 Vitamin D deficiency, unspecified: Secondary | ICD-10-CM | POA: Diagnosis present

## 2022-01-04 DIAGNOSIS — K8000 Calculus of gallbladder with acute cholecystitis without obstruction: Secondary | ICD-10-CM | POA: Diagnosis present

## 2022-01-04 DIAGNOSIS — G4730 Sleep apnea, unspecified: Secondary | ICD-10-CM | POA: Diagnosis present

## 2022-01-04 DIAGNOSIS — M961 Postlaminectomy syndrome, not elsewhere classified: Secondary | ICD-10-CM | POA: Diagnosis present

## 2022-01-04 DIAGNOSIS — K82A1 Gangrene of gallbladder in cholecystitis: Secondary | ICD-10-CM | POA: Diagnosis present

## 2022-01-04 DIAGNOSIS — Z83438 Family history of other disorder of lipoprotein metabolism and other lipidemia: Secondary | ICD-10-CM | POA: Diagnosis not present

## 2022-01-04 DIAGNOSIS — Z720 Tobacco use: Secondary | ICD-10-CM | POA: Diagnosis not present

## 2022-01-04 LAB — COMPREHENSIVE METABOLIC PANEL
ALT: 73 U/L — ABNORMAL HIGH (ref 0–44)
AST: 46 U/L — ABNORMAL HIGH (ref 15–41)
Albumin: 3.1 g/dL — ABNORMAL LOW (ref 3.5–5.0)
Alkaline Phosphatase: 64 U/L (ref 38–126)
Anion gap: 5 (ref 5–15)
BUN: 14 mg/dL (ref 6–20)
CO2: 25 mmol/L (ref 22–32)
Calcium: 8.4 mg/dL — ABNORMAL LOW (ref 8.9–10.3)
Chloride: 103 mmol/L (ref 98–111)
Creatinine, Ser: 0.92 mg/dL (ref 0.61–1.24)
GFR, Estimated: >60 mL/min (ref >60)
Glucose, Bld: 148 mg/dL — ABNORMAL HIGH (ref 70–99)
Potassium: 4.1 mmol/L (ref 3.5–5.1)
Sodium: 133 mmol/L — ABNORMAL LOW (ref 135–145)
Total Bilirubin: 0.8 mg/dL (ref 0.3–1.2)
Total Protein: 6.2 g/dL — ABNORMAL LOW (ref 6.5–8.1)

## 2022-01-04 LAB — CBC
HCT: 44.3 % (ref 39.0–52.0)
Hemoglobin: 15.2 g/dL (ref 13.0–17.0)
MCH: 31.2 pg (ref 26.0–34.0)
MCHC: 34.3 g/dL (ref 30.0–36.0)
MCV: 91 fL (ref 80.0–100.0)
Platelets: 219 10*3/uL (ref 150–400)
RBC: 4.87 MIL/uL (ref 4.22–5.81)
RDW: 12.6 % (ref 11.5–15.5)
WBC: 15.6 10*3/uL — ABNORMAL HIGH (ref 4.0–10.5)
nRBC: 0 % (ref 0.0–0.2)

## 2022-01-04 LAB — SURGICAL PATHOLOGY

## 2022-01-04 MED ORDER — ACETAMINOPHEN 325 MG PO TABS
650.0000 mg | ORAL_TABLET | Freq: Four times a day (QID) | ORAL | Status: DC
  Filled 2022-01-04: qty 2

## 2022-01-04 MED ORDER — ACETAMINOPHEN 325 MG PO TABS: 650.0000 mg | ORAL_TABLET | Freq: Four times a day (QID) | ORAL | Status: DC | PRN

## 2022-01-04 MED ORDER — OXYCODONE HCL 5 MG PO TABS: 5.0000 mg | ORAL_TABLET | Freq: Four times a day (QID) | ORAL | 0 refills | Status: DC | PRN

## 2022-01-04 MED ORDER — AMOXICILLIN-POT CLAVULANATE 875-125 MG PO TABS: 1.0000 | ORAL_TABLET | Freq: Two times a day (BID) | ORAL | 0 refills | Status: AC

## 2022-01-04 MED ORDER — SIMETHICONE 80 MG PO CHEW: 40.0000 mg | CHEWABLE_TABLET | Freq: Four times a day (QID) | ORAL | Status: DC | PRN

## 2022-01-04 NOTE — Discharge Summary (Signed)
[facility] Discharge Summary   Patient ID: Bobby Saunders MRN: [id number] DOB/AGE: 1966-09-29 56 y.o.  Admit date: 01/02/2022 Discharge date: 01/04/2022  Admitting Diagnosis: Acute cholecystitis   Discharge Diagnosis Acute cholecystitis with gangrene S/p laparoscopic cholecystectomy  Consultants None   Imaging: US Abdomen Complete  Result Date: 01/02/2022 CLINICAL DATA:  Acute right upper quadrant pain for 4 days. Nausea vomiting. EXAM: ABDOMEN ULTRASOUND COMPLETE COMPARISON:  Abdomen/pelvis CT 01/02/2022. FINDINGS: Gallbladder: Gallstones evident measuring up to 17 mm. Gallbladder wall is thickened at 4-5 mm with gallbladder wall edema evident and trace pericholecystic fluid noted. Common bile duct: Diameter: 5-6 mm Liver: Echogenic liver parenchyma suggests underlying fatty deposition. Portal vein is patent on color Doppler imaging with normal direction of blood flow towards the liver. IVC: No abnormality visualized. Pancreas: Visualized portion unremarkable. Spleen: Size and appearance within normal limits. Right Kidney: Length: 11.2 cm. Echogenicity within normal limits. No mass or hydronephrosis visualized. Left Kidney: Length: 11.0 cm. 12 mm exophytic cyst towards the lower pole compatible with cyst seen on CT earlier today. Echogenicity within normal limits. No mass or hydronephrosis visualized. Abdominal aorta: No aneurysm visualized. Other findings: None. IMPRESSION: 1. Cholelithiasis with gallbladder wall thickening and trace pericholecystic fluid. Imaging features consistent with acute cholecystitis. 2. No biliary dilatation. 3. 12 mm exophytic cyst lower pole left kidney. These results will be called to the ordering clinician or representative by the Radiologist Assistant, and communication documented in the PACS or [employer]. Electronically Signed   By: Kennith Center M.D.   On: 01/02/2022 11:08    Procedures Dr. Renae Fickle Stechschulte (01/03/22) - Laparoscopic  Cholecystectomy   Hospital Course:  Patient is a 56 year old male who presented to PCP with 4 days of abdominal pain.  Workup showed concern for acute cholecystitis and PCP contacted general surgery.  Patient was admitted and underwent procedure listed above.  Tolerated procedure well and was transferred to the floor.  Diet was advanced as tolerated.  On POD1, the patient was voiding well, tolerating diet, ambulating well, pain well controlled, vital signs stable, incisions c/d/i and felt stable for discharge home.  Patient will follow up in our office in 1 week for drain removal and knows to call with questions or concerns.  He will call to confirm appointment date/time.    Physical Exam: General:  Alert, NAD, pleasant, comfortable Abd:  Soft, ND, mild tenderness, incisions C/D/I, drain with minimal sanguinous drainage   I or a member of my team have reviewed this patient in the Controlled Substance Database.   Allergies as of 01/04/2022   No Known Allergies      Medication List     STOP taking these medications    oxyCODONE-acetaminophen 5-325 MG tablet Commonly known as: PERCOCET/ROXICET       TAKE these medications    Accu-Chek Guide test strip Generic drug: glucose blood Use as instructed to check blood sugar twice daily. Dx E11.9   acetaminophen 325 MG tablet Commonly known as: TYLENOL Take 2 tablets (650 mg total) by mouth every 6 (six) hours as needed for mild pain or fever.   AMBULATORY NON FORMULARY MEDICATION Single glucometer with lancets, test strips.  Test 3 times a day   atorvastatin 40 MG tablet Commonly known as: LIPITOR TAKE 1 TABLET BY MOUTH EVERY DAY   metFORMIN 1000 MG tablet Commonly known as: GLUCOPHAGE TAKE 1 TABLET TWICE A DAY WITH FOOD   oxyCODONE 5 MG immediate release tablet Commonly known as: Oxy IR/ROXICODONE  Take 1-2 tablets (5-10 mg total) by mouth every 6 (six) hours as needed for moderate pain or severe pain (5 mg for moderate, 10 mg  for severe).   Ozempic (1 MG/DOSE) 4 MG/3ML Sopn Generic drug: Semaglutide (1 MG/DOSE) INJECT 1 MG EVERY WEEK What changed:  how much to take how to take this when to take this additional instructions   pantoprazole 40 MG tablet Commonly known as: PROTONIX Take 1 tablet (40 mg total) by mouth daily.   simethicone 80 MG chewable tablet Commonly known as: MYLICON Chew 0.5 tablets (40 mg total) by mouth every 6 (six) hours as needed for flatulence (bloating).   traMADol 50 MG tablet Commonly known as: ULTRAM Take 1 tablet (50 mg total) by mouth 3 (three) times daily as needed.          Follow-up Information     [facility]. Go on 02/01/2022.   Specialty: General Surgery Why: 1:30 PM. Please arrive 15 min prior to appointment time and have ID and insurance card with you. Contact information: [address] [place] [address] [phone]         [facility]. Go on 01/11/2022.   Specialty: General Surgery Why: 2:00 PM for drain removal. Please arrive 30 min prior to appointment time. Bring photo ID and insurance card with you. Contact information: [address] [address] [phone]                 Signed: Norm Parcel , [facility] 01/04/2022, 10:44 AM Please see Amion for pager number during day hours 7:00am-4:30pm

## 2022-01-04 NOTE — Progress Notes (Signed)
Reviewed care of JP drain w pt (site dsg change, emptying, etc). Reviewed written d/c instructions w pt and all questions answered. He verbalized understanding. D/ C per w/c w all belongings in stable condition.

## 2022-01-05 ENCOUNTER — Telehealth: Payer: Self-pay | Admitting: General Practice

## 2022-01-05 NOTE — Telephone Encounter (Signed)
Transition Care Management Follow-up Telephone Call Date of discharge and from where: 01/04/22 from [facility] How have you been since you were released from the hospital? Doing better; he had his gallbladder removed and is scheduled to the see the surgeon on 01/10/22. Any questions or concerns? No  Items Reviewed: Did the pt receive and understand the discharge instructions provided? Yes  Medications obtained and verified? No  Other? No  Any new allergies since your discharge? No  Dietary orders reviewed? Yes Do you have support at home? Yes   Home Care and Equipment/Supplies: Were home health services ordered? no  Functional Questionnaire: (I = Independent and D = Dependent) ADLs: I  Bathing/Dressing- I  Meal Prep- I  Eating- I  Maintaining continence- I  Transferring/Ambulation- I  Managing Meds- I  Follow up appointments reviewed:  PCP Hospital f/u appt confirmed? No   Specialist Hospital f/u appt confirmed? Yes  Scheduled to see the surgeon on 01/10/22. Are transportation arrangements needed? No  If their condition worsens, is the pt aware to call PCP or go to the Emergency Dept.? Yes Was the patient provided with contact information for the PCP's office or ED? Yes Was to pt encouraged to call back with questions or concerns? Yes

## 2022-01-09 ENCOUNTER — Ambulatory Visit: Payer: BC Managed Care – PPO | Admitting: Sports Medicine

## 2022-01-20 ENCOUNTER — Other Ambulatory Visit: Payer: Self-pay | Admitting: Sports Medicine

## 2022-01-20 DIAGNOSIS — E119 Type 2 diabetes mellitus without complications: Secondary | ICD-10-CM

## 2022-01-21 ENCOUNTER — Other Ambulatory Visit: Payer: Self-pay | Admitting: Sports Medicine

## 2022-02-08 ENCOUNTER — Other Ambulatory Visit: Payer: Self-pay | Admitting: Sports Medicine

## 2022-02-08 DIAGNOSIS — M5412 Radiculopathy, cervical region: Secondary | ICD-10-CM

## 2022-02-09 NOTE — Telephone Encounter (Signed)
Patient aware refill has been sent to pharmacy 

## 2022-03-10 ENCOUNTER — Other Ambulatory Visit: Payer: Self-pay | Admitting: Sports Medicine

## 2022-03-13 ENCOUNTER — Other Ambulatory Visit: Payer: Self-pay | Admitting: Sports Medicine

## 2022-03-13 DIAGNOSIS — M5412 Radiculopathy, cervical region: Secondary | ICD-10-CM

## 2022-03-15 IMAGING — US US ABDOMEN COMPLETE
1 series · 13 of 25 positions shown · non-contrast
Comparison: Abdomen/pelvis CT 01/02/2022.

CLINICAL DATA: Acute right upper quadrant pain for 4 days. Nausea
vomiting.

EXAM:
ABDOMEN ULTRASOUND COMPLETE

[Series 1: us abdomen complete · 13 of 100 slices shown]
[im 1/100]
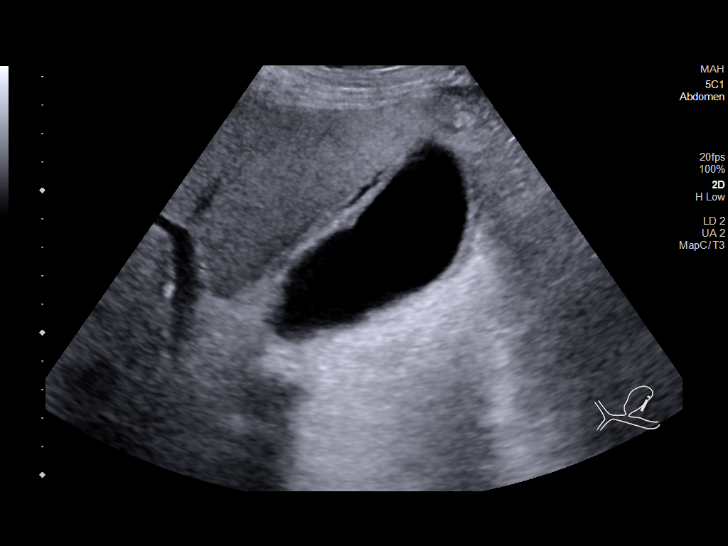
[im 9/100]
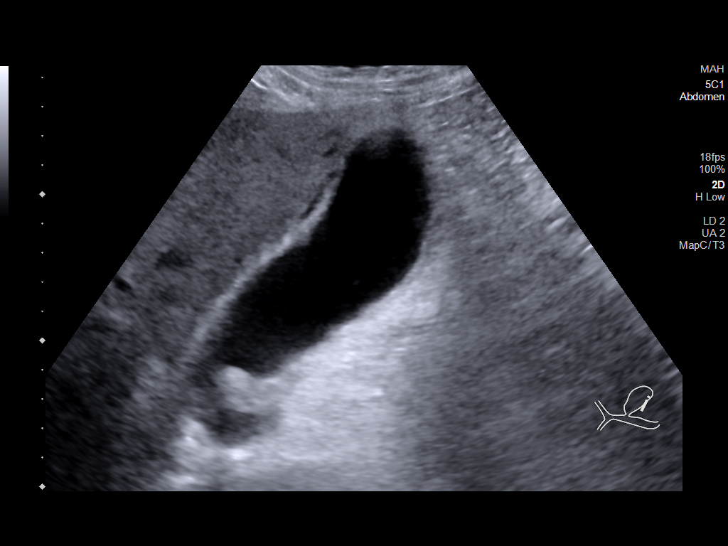
[im 17/100]
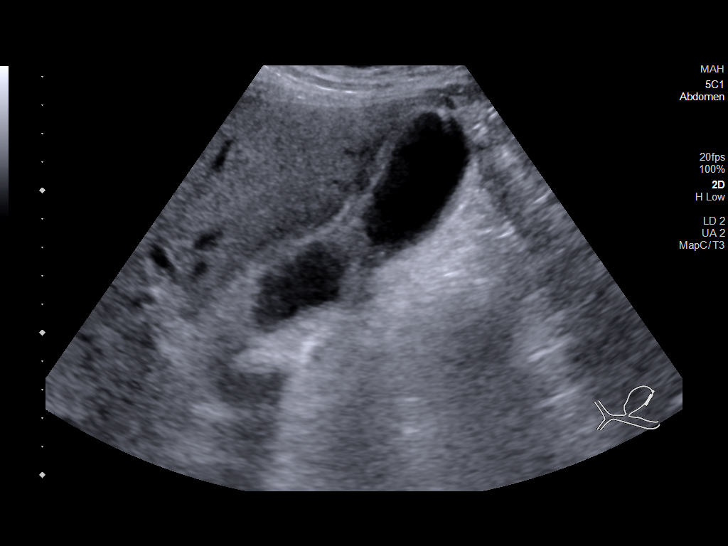
[im 25/100]
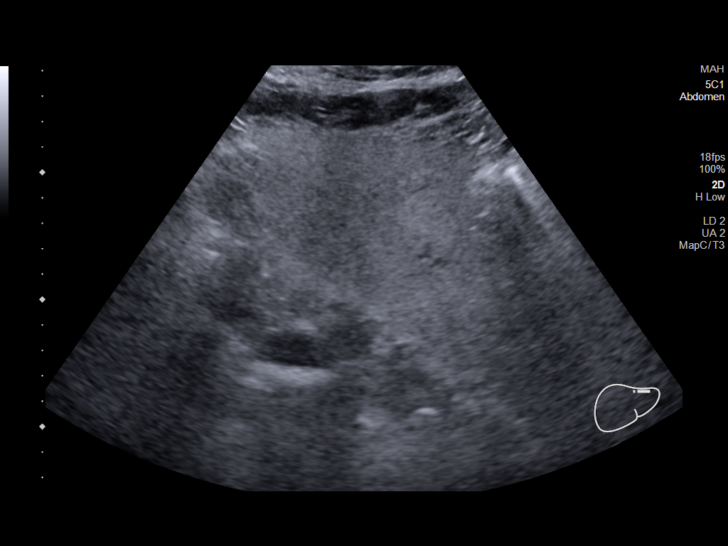
[im 34/100]
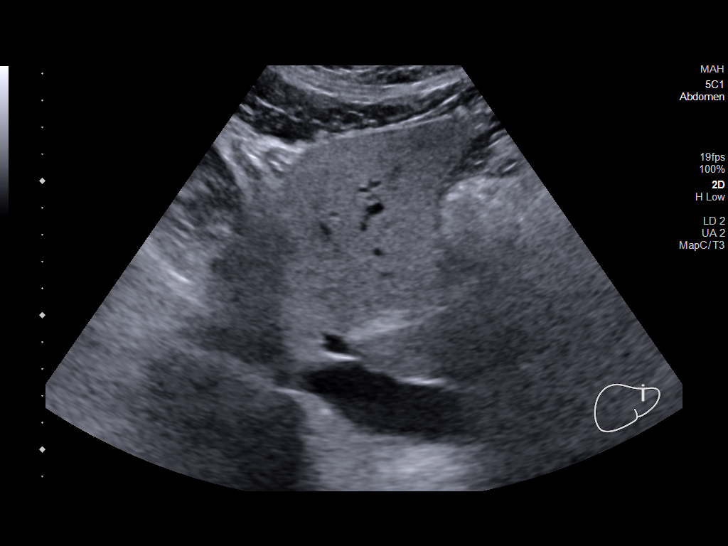
[im 42/100]
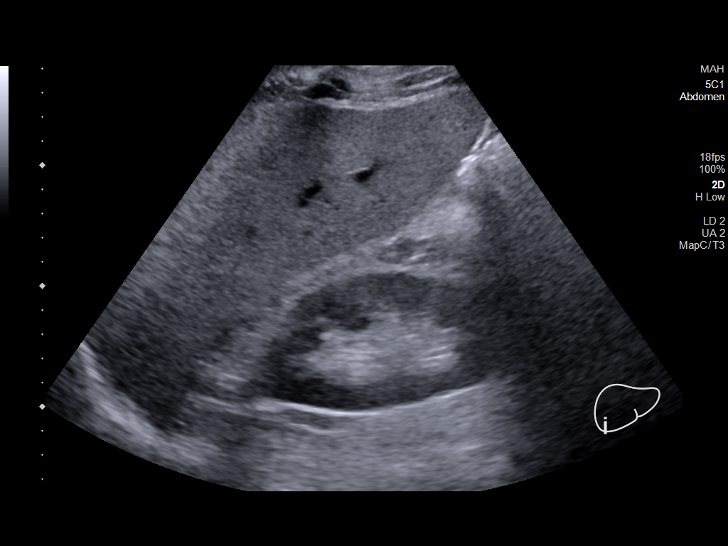
[im 50/100]
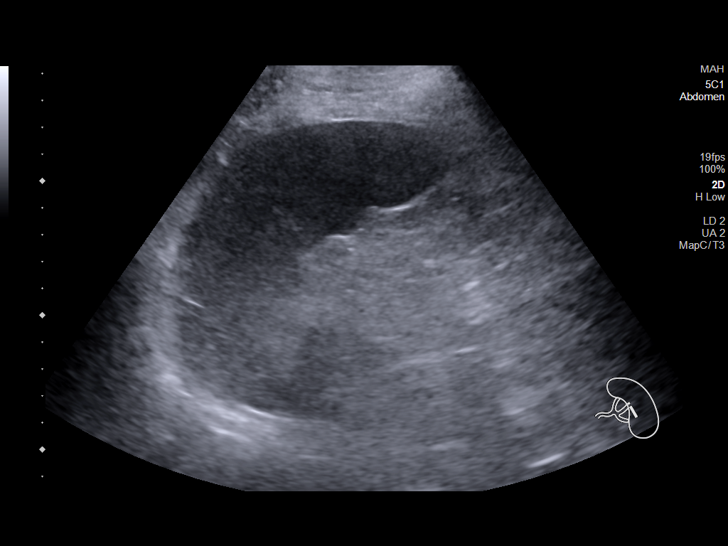
[im 58/100]
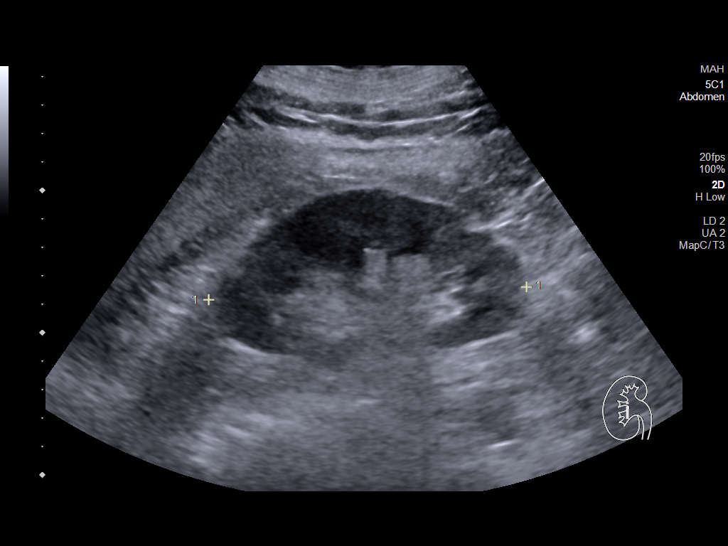
[im 67/100]
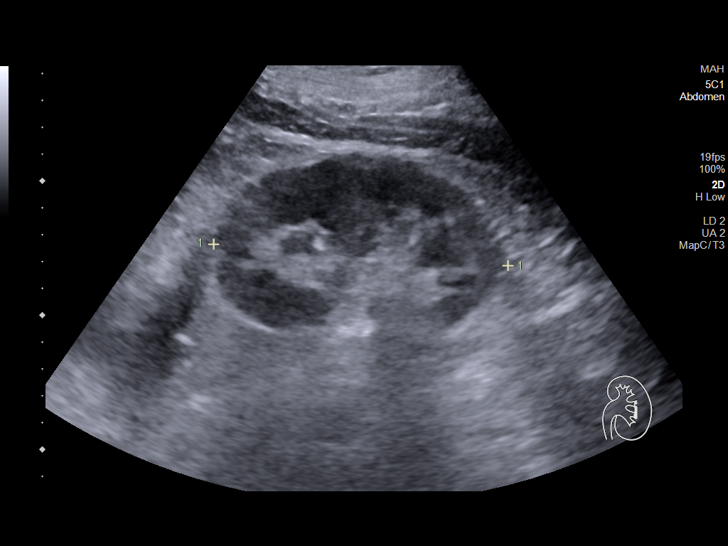
[im 75/100]
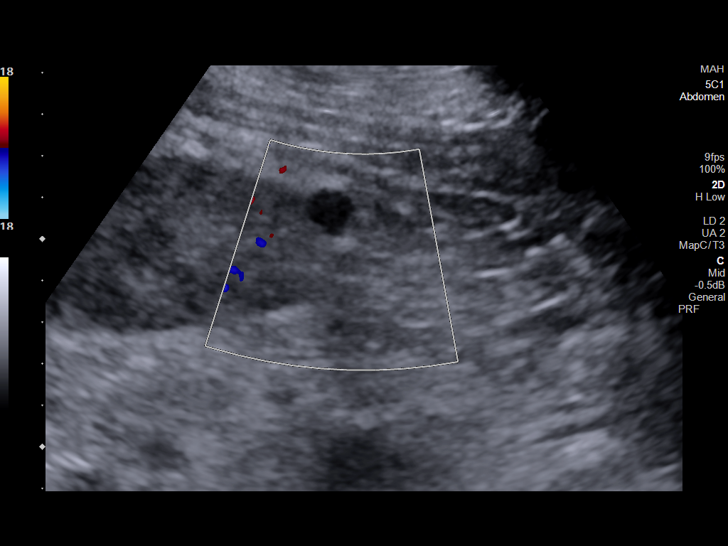
[im 83/100]
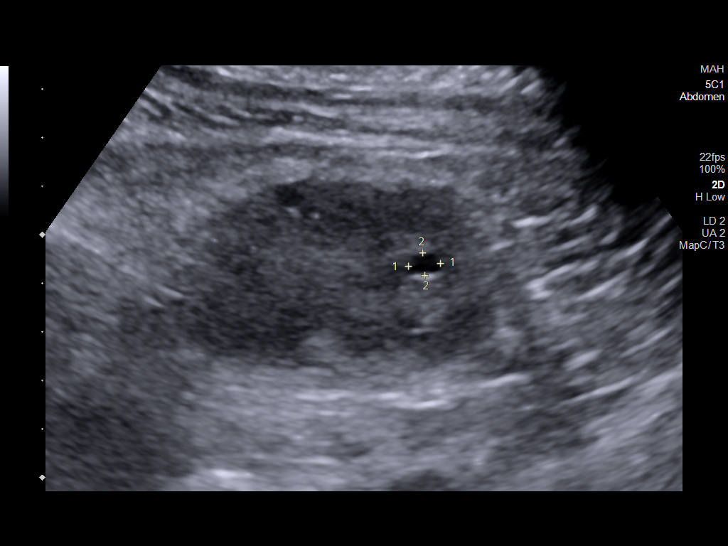
[im 91/100]
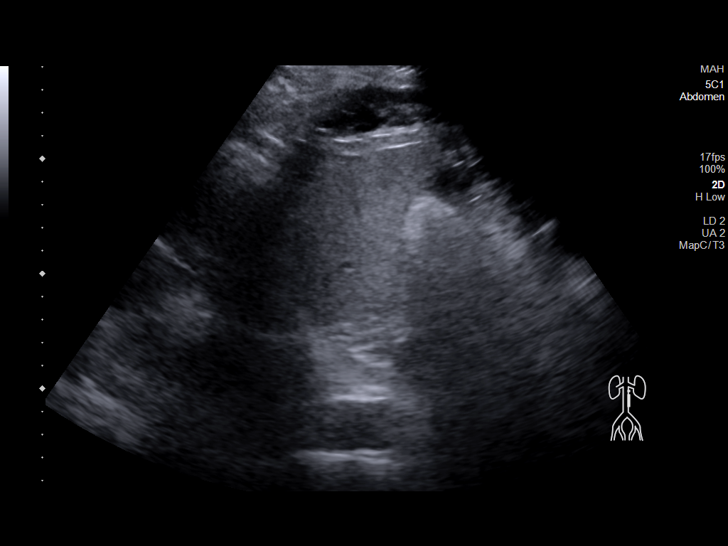
[im 100/100]
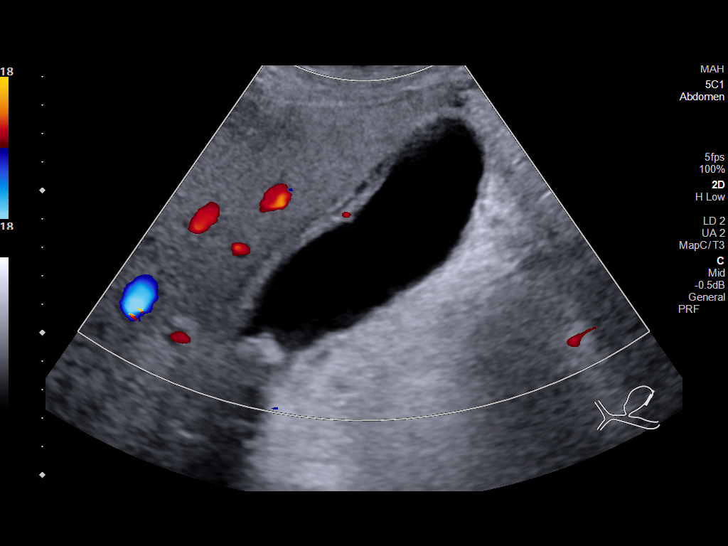

[13 of 25 positions shown; findings below may reference images not displayed]

FINDINGS: Gallbladder: Gallstones evident measuring up to 17 mm. Gallbladder
wall is thickened at 4-5 mm with gallbladder wall edema evident and
trace pericholecystic fluid noted.

Common bile duct: Diameter: 5-6 mm

Liver: Echogenic liver parenchyma suggests underlying fatty
deposition. Portal vein is patent on color Doppler imaging with
normal direction of blood flow towards the liver.

IVC: No abnormality visualized.

Pancreas: Visualized portion unremarkable.

Spleen: Size and appearance within normal limits.

Right Kidney: Length: 11.2 cm. Echogenicity within normal limits. No
mass or hydronephrosis visualized.

Left Kidney: Length: 11.0 cm. 12 mm exophytic cyst towards the lower
pole compatible with cyst seen on CT earlier today. Echogenicity
within normal limits. No mass or hydronephrosis visualized.

Abdominal aorta: No aneurysm visualized.

Other findings: None.
IMPRESSION: 1. Cholelithiasis with gallbladder wall thickening and trace
pericholecystic fluid. Imaging features consistent with acute
cholecystitis.
2. No biliary dilatation.
3. 12 mm exophytic cyst lower pole left kidney.

These results will be called to the ordering clinician or
representative by the Radiologist Assistant, and communication
documented in the PACS or [REDACTED].

## 2022-03-15 IMAGING — CT CT ABD-PELV W/ CM
2 of 5 series · 15 of 46 positions shown, 17 images · IV contrast (APPLIED)
Comparison: Lumbar MRI 03/27/2019

CLINICAL DATA: Abdominal pain, acute, nonlocalized. Acute right
upper quadrant abdominal pain with guarding and rigidity for 5 days.
Alternating constipation, diarrhea, nausea and vomiting. History of
kidney stones. No previous relevant surgery.

EXAM:
CT ABDOMEN AND PELVIS WITH CONTRAST
TECHNIQUE: Multidetector CT imaging of the abdomen and pelvis was performed
using the standard protocol following bolus administration of
intravenous contrast.

[Series 2: axial st · axial · 0.89mm/px · z∈[-521,-41]mm · 12 of 108 slices shown, 14 images]
[im 6/108  soft-tissue]
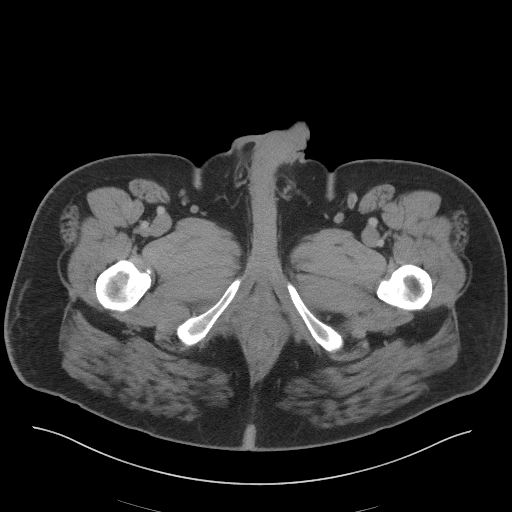
[im 6/108  bone]
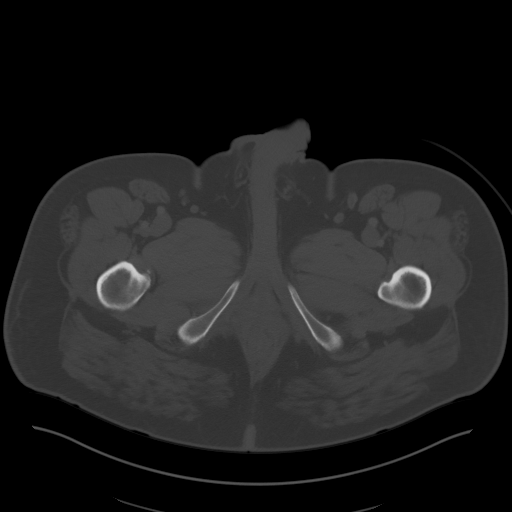
[im 17/108  soft-tissue]
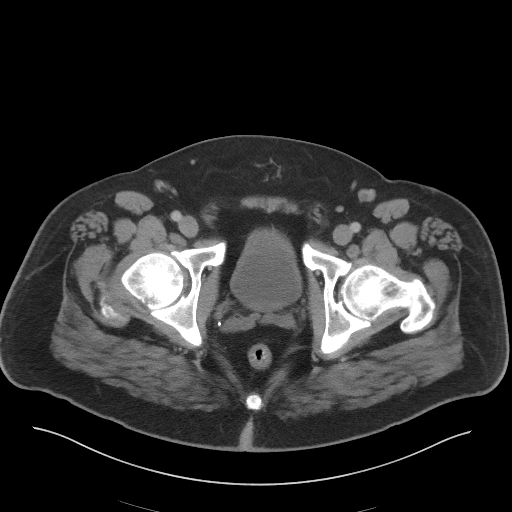
[im 23/108  soft-tissue]
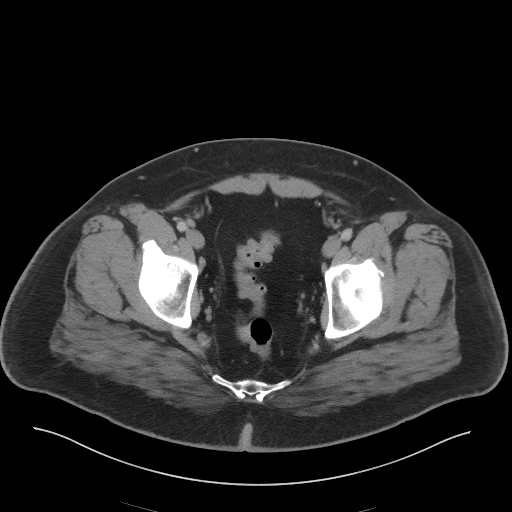
[im 34/108  soft-tissue]
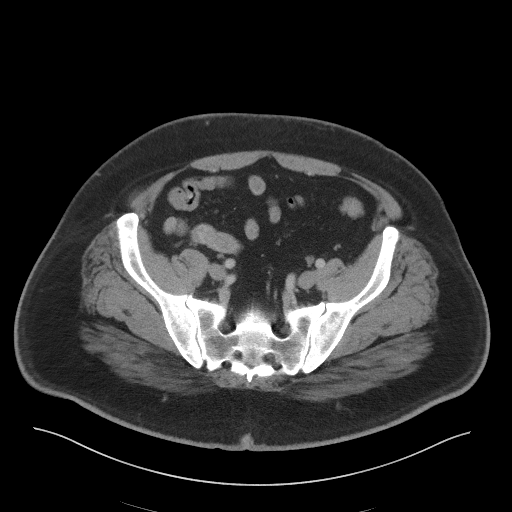
[im 40/108  soft-tissue]
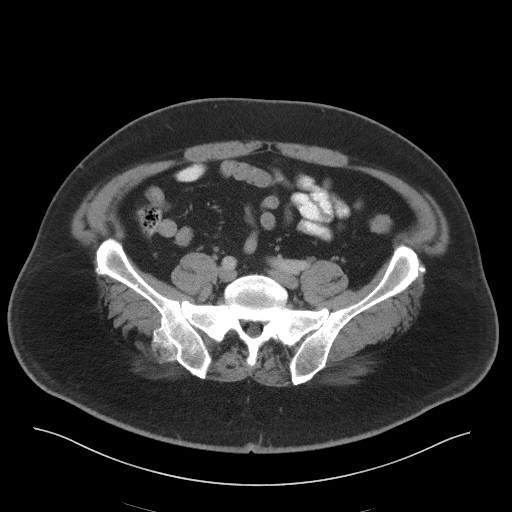
[im 51/108  soft-tissue]
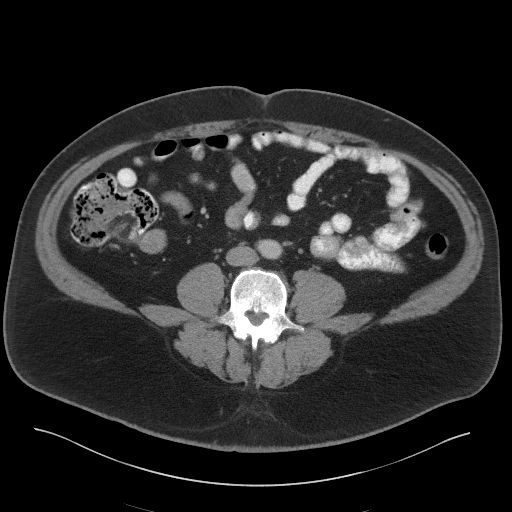
[im 57/108  soft-tissue]
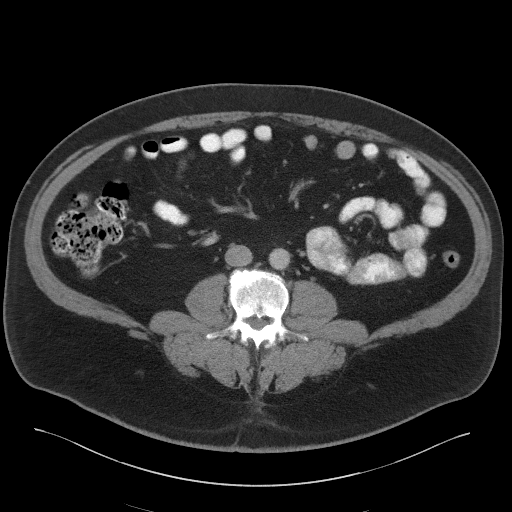
[im 68/108  soft-tissue]
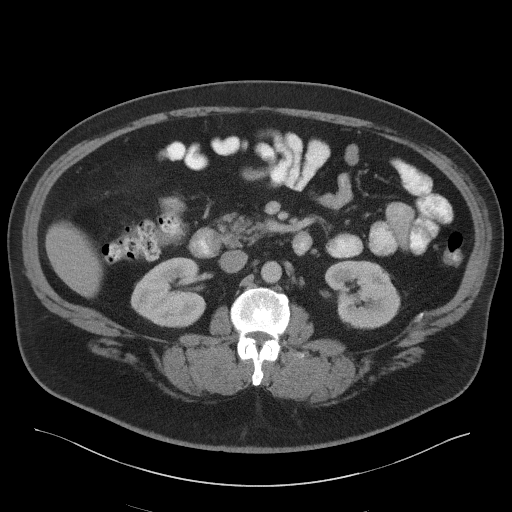
[im 74/108  soft-tissue]
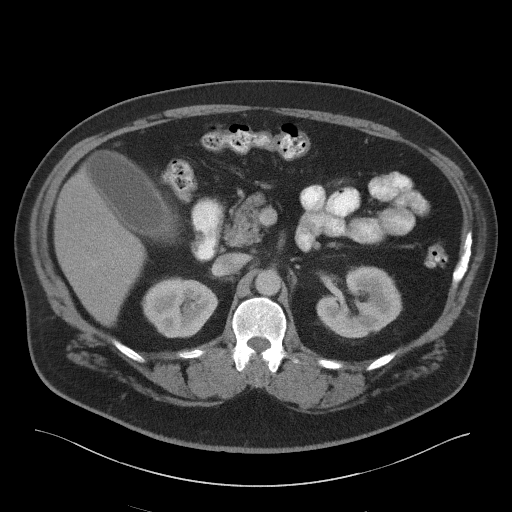
[im 74/108  bone]
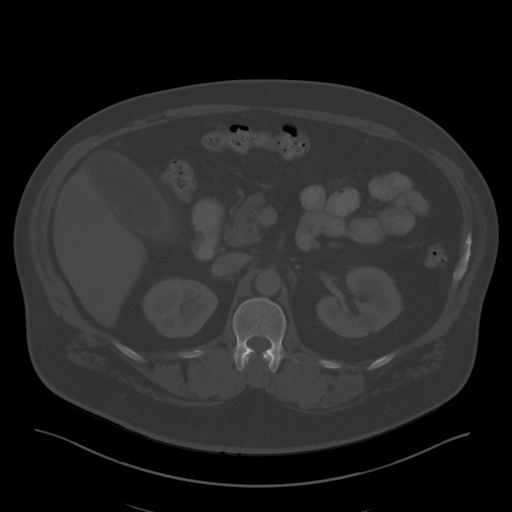
[im 85/108  soft-tissue]
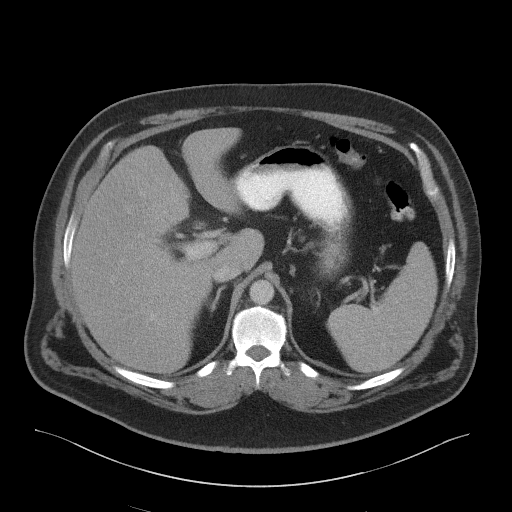
[im 91/108  soft-tissue]
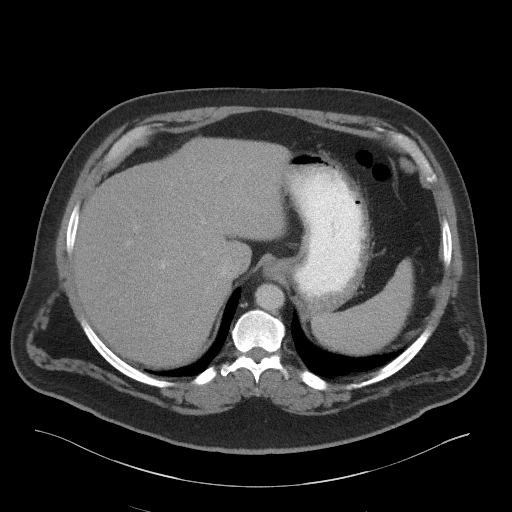
[im 102/108  soft-tissue]
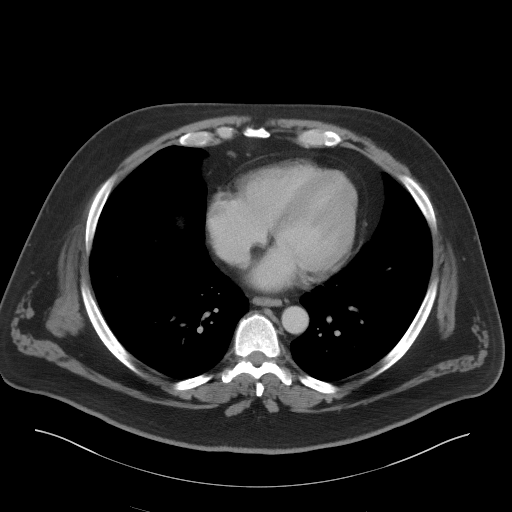

[Series 5: coronal st · coronal · 0.93mm/px · 3 of 108 slices shown]
[im 36/108  soft-tissue]
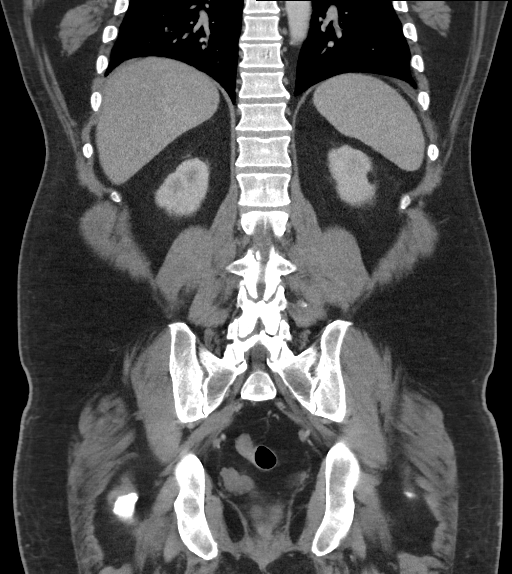
[im 48/108  soft-tissue]
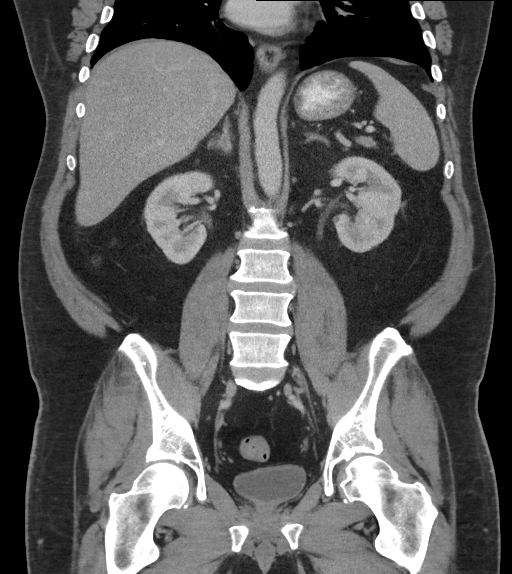
[im 60/108  soft-tissue]
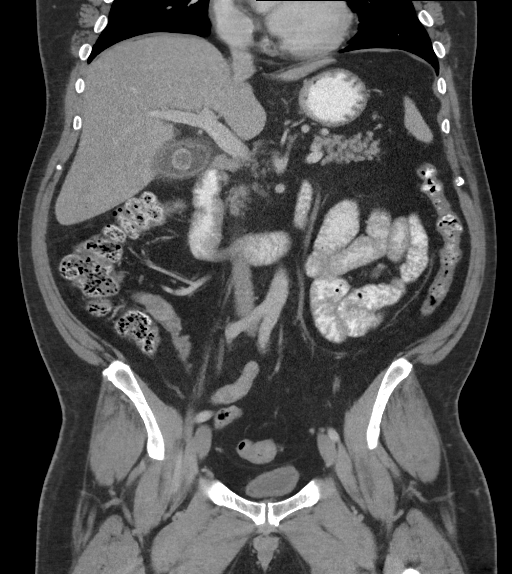

[15 of 46 positions shown; findings below may reference images not displayed]

RADIATION DOSE REDUCTION: This exam was performed according to the
departmental dose-optimization program which includes automated
exposure control, adjustment of the mA and/or kV according to
patient size and/or use of iterative reconstruction technique.

CONTRAST:  100mL OMNIPAQUE IOHEXOL 300 MG/ML  SOLN
FINDINGS: Lower chest: Minimal dependent atelectasis at both lung bases. The
lung bases are otherwise clear. No significant pleural or
pericardial effusion.

Hepatobiliary: The liver is normal in density without suspicious
focal abnormality. There are at least 2 partially calcified
gallstones. The gallbladder is distended with wall thickening and
surrounding inflammatory changes, highly suspicious for acute
cholecystitis. No significant biliary dilatation.

Pancreas: Unremarkable. No pancreatic ductal dilatation or
surrounding inflammatory changes.

Spleen: Normal in size without focal abnormality.

Adrenals/Urinary Tract: Both adrenal glands appear normal. There is
a small exophytic cyst in the lower pole of the left kidney. Both
kidneys otherwise appear normal. No evidence of urinary tract
calculus, hydronephrosis or perinephric soft tissue stranding. The
bladder appears unremarkable.

Stomach/Bowel: Enteric contrast was administered and has passed into
the distal colon. The stomach appears unremarkable for its degree of
distension. No evidence of bowel wall thickening, distention or
surrounding inflammatory change. Small retrocecal appendix appears
normal. Minimal diverticular changes in the sigmoid colon.

Vascular/Lymphatic: There are no enlarged abdominal or pelvic lymph
nodes. Small lymph nodes in the porta hepatis, likely reactive. No
significant vascular findings.

Reproductive: The prostate gland demonstrates central dystrophic
calcifications. The seminal vesicles appear unremarkable.

Other: No evidence of abdominal wall mass or hernia. No ascites.

Musculoskeletal: No acute or significant osseous findings. Status
post posterior decompression at L3-4. Posterolateral osseous
excrescence from the right iliac bone appears unchanged from
previous MRI and may reflect an osteochondroma or sequela of prior
bone grafting. No associated soft tissue mass or surrounding
inflammation.
IMPRESSION: 1. Cholelithiasis with gallbladder wall thickening, surrounding
inflammation and gallbladder distension, highly suspicious for acute
cholecystitis. No significant biliary dilatation.
2. No other acute abdominal findings.
3. Postsurgical changes in the lumbar spine with grossly stable
osseous excrescence from the right iliac bone.

## 2022-04-04 ENCOUNTER — Other Ambulatory Visit: Payer: Self-pay | Admitting: Sports Medicine

## 2022-04-04 DIAGNOSIS — E119 Type 2 diabetes mellitus without complications: Secondary | ICD-10-CM

## 2022-04-10 ENCOUNTER — Other Ambulatory Visit: Payer: Self-pay | Admitting: Sports Medicine

## 2022-04-10 DIAGNOSIS — M5412 Radiculopathy, cervical region: Secondary | ICD-10-CM

## 2022-05-05 ENCOUNTER — Other Ambulatory Visit: Payer: Self-pay | Admitting: Sports Medicine

## 2022-05-05 DIAGNOSIS — E119 Type 2 diabetes mellitus without complications: Secondary | ICD-10-CM

## 2022-06-06 ENCOUNTER — Encounter: Payer: Self-pay | Admitting: Sports Medicine

## 2022-06-06 ENCOUNTER — Ambulatory Visit (INDEPENDENT_AMBULATORY_CARE_PROVIDER_SITE_OTHER): Payer: BC Managed Care – PPO | Admitting: Sports Medicine

## 2022-06-06 VITALS — BP 120/84 | HR 52 | Ht 70.0 in | Wt 271.0 lb

## 2022-06-06 DIAGNOSIS — N139 Obstructive and reflux uropathy, unspecified: Secondary | ICD-10-CM

## 2022-06-06 DIAGNOSIS — E119 Type 2 diabetes mellitus without complications: Secondary | ICD-10-CM

## 2022-06-06 DIAGNOSIS — Z0000 Encounter for general adult medical examination without abnormal findings: Secondary | ICD-10-CM

## 2022-06-06 MED ORDER — SEMAGLUTIDE (2 MG/DOSE) 8 MG/3ML ~~LOC~~ SOPN
2.0000 mg | PEN_INJECTOR | SUBCUTANEOUS | 11 refills | Status: DC
Start: 2022-06-06 — End: 2022-10-10

## 2022-06-06 NOTE — Assessment & Plan Note (Signed)
Annual physical as above, recovered from his acute cholecystitis. Checking routine labs.

## 2022-06-06 NOTE — Assessment & Plan Note (Signed)
Bumping up Ozempic, doubling dose, he will also add 30 minutes of moderate intensity aerobic exercise into his routine to help lose weight.

## 2022-06-06 NOTE — Progress Notes (Signed)
Subjective:    CC: Annual Physical Exam  HPI:  This patient is here for their annual physical  I reviewed the past medical history, family history, social history, surgical history, and allergies today and no changes were needed.  Please see the problem list section below in epic for further details.  Past Medical History: Past Medical History:  Diagnosis Date   Sleep apnea    cpap 6 yrs   Vitamin D insufficiency 01/07/2018   Past Surgical History: Past Surgical History:  Procedure Laterality Date   CHOLECYSTECTOMY N/A 01/03/2022   Procedure: LAPAROSCOPIC CHOLECYSTECTOMY;  Surgeon: Stechschulte, Hyman Hopes, MD;  Location: [facility];  Service: General;  Laterality: N/A;   ELBOW SURGERY Bilateral 01 last   numerous lft , one right   LUMBAR LAMINECTOMY/DECOMPRESSION MICRODISCECTOMY N/A 05/13/2014   Procedure: LUMBAR LAMINECTOMY/DECOMPRESSION MICRODISCECTOMY;  Surgeon: Emilee Hero, MD;  Location: [facility];  Service: Orthopedics;  Laterality: N/A;  Lumbar 3-4 decompression   Social History: Social History   Socioeconomic History   Marital status: Married    Spouse name: Not on file   Number of children: Not on file   Years of education: Not on file   Highest education level: Not on file  Occupational History   Not on file  Tobacco Use   Smoking status: Former    Packs/day: 1.00    Years: 25.00    Total pack years: 25.00    Types: Cigarettes    Quit date: 05/05/2005    Years since quitting: 17.0   Smokeless tobacco: Current  Substance and Sexual Activity   Alcohol use: No    Alcohol/week: 0.0 standard drinks of alcohol   Drug use: No   Sexual activity: Yes  Other Topics Concern   Not on file  Social History Narrative   Not on file   Social Determinants of Health   Financial Resource Strain: Not on file  Food Insecurity: Not on file  Transportation Needs: Not on file  Physical Activity: Not on file  Stress: Not on file  Social Connections: Not on file   Family  History: Family History  Problem Relation Age of Onset   Cancer Sister        colon   Hyperlipidemia Mother    Hypertension Mother    Cancer Maternal Uncle    Cancer Maternal Grandmother    Sudden death Neg Hx    Heart attack Neg Hx    Diabetes Neg Hx    Allergies: No Known Allergies Medications: See med rec.  Review of Systems: No headache, visual changes, nausea, vomiting, diarrhea, constipation, dizziness, abdominal pain, skin rash, fevers, chills, night sweats, swollen lymph nodes, weight loss, chest pain, body aches, joint swelling, muscle aches, shortness of breath, mood changes, visual or auditory hallucinations.  Objective:    General: Well Developed, well nourished, and in no acute distress.  Neuro: Alert and oriented x3, extra-ocular muscles intact, sensation grossly intact. Cranial nerves II through XII are intact, motor, sensory, and coordinative functions are all intact. HEENT: Normocephalic, atraumatic, pupils equal round reactive to light, neck supple, no masses, no lymphadenopathy, thyroid nonpalpable. Oropharynx, nasopharynx, external ear canals are unremarkable. Skin: Warm and dry, no rashes noted.  Cardiac: Regular rate and rhythm, no murmurs rubs or gallops.  Respiratory: Clear to auscultation bilaterally. Not using accessory muscles, speaking in full sentences.  Abdominal: Soft, nontender, nondistended, positive bowel sounds, no masses, no organomegaly.  Musculoskeletal: Shoulder, elbow, wrist, hip, knee, ankle stable, and with full range  of motion.  Impression and Recommendations:    The patient was counselled, risk factors were discussed, anticipatory guidance given.  Annual physical exam Annual physical as above, recovered from his acute cholecystitis. Checking routine labs.   Controlled type 2 diabetes mellitus without complication, without long-term current use of insulin (HCC) Bumping up Ozempic, doubling dose, he will also add 30 minutes of moderate  intensity aerobic exercise into his routine to help lose weight.   ____________________________________________ [name]. Benjamin Stain, M.D., ABFM., CAQSM., AME. Primary Care and Sports Medicine [facility]  Adjunct Professor of Family Medicine  [employer] [facility] [employer]  Restaurant manager, fast food

## 2022-06-07 LAB — COMPLETE METABOLIC PANEL WITH GFR
AG Ratio: 1.8 (calc) (ref 1.0–2.5)
ALT: 22 U/L (ref 9–46)
AST: 18 U/L (ref 10–35)
Albumin: 4.2 g/dL (ref 3.6–5.1)
Alkaline phosphatase (APISO): 67 U/L (ref 35–144)
BUN: 14 mg/dL (ref 7–25)
CO2: 29 mmol/L (ref 20–32)
Calcium: 9.8 mg/dL (ref 8.6–10.3)
Chloride: 105 mmol/L (ref 98–110)
Creat: 1.13 mg/dL (ref 0.70–1.30)
Globulin: 2.3 g/dL (calc) (ref 1.9–3.7)
Glucose, Bld: 97 mg/dL (ref 65–99)
Potassium: 5 mmol/L (ref 3.5–5.3)
Sodium: 142 mmol/L (ref 135–146)
Total Bilirubin: 0.6 mg/dL (ref 0.2–1.2)
Total Protein: 6.5 g/dL (ref 6.1–8.1)
eGFR: 77 mL/min/{1.73_m2} (ref >=60)

## 2022-06-07 LAB — CBC
HCT: 50 % (ref 38.5–50.0)
Hemoglobin: 16.9 g/dL (ref 13.2–17.1)
MCH: 31 pg (ref 27.0–33.0)
MCHC: 33.8 g/dL (ref 32.0–36.0)
MCV: 91.6 fL (ref 80.0–100.0)
MPV: 11.5 fL (ref 7.5–12.5)
Platelets: 182 10*3/uL (ref 140–400)
RBC: 5.46 10*6/uL (ref 4.20–5.80)
RDW: 13.3 % (ref 11.0–15.0)
WBC: 6.2 10*3/uL (ref 3.8–10.8)

## 2022-06-07 LAB — PSA, TOTAL AND FREE
PSA, % Free: 40 % (calc) (ref >25)
PSA, Free: 0.2 ng/mL
PSA, Total: 0.5 ng/mL (ref <=4.0)

## 2022-06-07 LAB — LIPID PANEL
Cholesterol: 140 mg/dL (ref <200)
HDL: 46 mg/dL (ref >=40)
LDL Cholesterol (Calc): 80 mg/dL (calc)
Non-HDL Cholesterol (Calc): 94 mg/dL (calc) (ref <130)
Total CHOL/HDL Ratio: 3 (calc) (ref <5.0)
Triglycerides: 63 mg/dL (ref <150)

## 2022-06-07 LAB — MICROALBUMIN / CREATININE URINE RATIO
Creatinine, Urine: 102 mg/dL (ref 20–320)
Microalb Creat Ratio: 2 mcg/mg creat (ref <30)
Microalb, Ur: 0.2 mg/dL

## 2022-06-07 LAB — TSH: TSH: 0.97 mIU/L (ref 0.40–4.50)

## 2022-06-07 LAB — HEMOGLOBIN A1C
Hgb A1c MFr Bld: 6 % of total Hgb — ABNORMAL HIGH (ref <5.7)
Mean Plasma Glucose: 126 mg/dL
eAG (mmol/L): 7 mmol/L

## 2022-06-20 ENCOUNTER — Other Ambulatory Visit: Payer: Self-pay | Admitting: Sports Medicine

## 2022-06-20 DIAGNOSIS — E119 Type 2 diabetes mellitus without complications: Secondary | ICD-10-CM

## 2022-07-01 ENCOUNTER — Other Ambulatory Visit: Payer: Self-pay | Admitting: Sports Medicine

## 2022-07-01 DIAGNOSIS — M5412 Radiculopathy, cervical region: Secondary | ICD-10-CM

## 2022-07-21 ENCOUNTER — Other Ambulatory Visit: Payer: Self-pay | Admitting: Sports Medicine

## 2022-07-21 DIAGNOSIS — E119 Type 2 diabetes mellitus without complications: Secondary | ICD-10-CM

## 2022-07-22 ENCOUNTER — Other Ambulatory Visit: Payer: Self-pay | Admitting: Sports Medicine

## 2022-08-12 ENCOUNTER — Other Ambulatory Visit: Payer: Self-pay | Admitting: Sports Medicine

## 2022-08-12 DIAGNOSIS — M5412 Radiculopathy, cervical region: Secondary | ICD-10-CM

## 2022-09-20 ENCOUNTER — Telehealth: Payer: Self-pay

## 2022-09-20 NOTE — Telephone Encounter (Signed)
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

## 2022-09-21 NOTE — Telephone Encounter (Addendum)
I can write the letter but he needs to be wary of taking the medications before driving.  Letter can be downloaded from my chart.

## 2022-09-22 ENCOUNTER — Other Ambulatory Visit: Payer: Self-pay | Admitting: Sports Medicine

## 2022-09-22 DIAGNOSIS — K219 Gastro-esophageal reflux disease without esophagitis: Secondary | ICD-10-CM

## 2022-10-02 ENCOUNTER — Encounter: Payer: Self-pay | Admitting: Sports Medicine

## 2022-10-02 ENCOUNTER — Ambulatory Visit: Payer: BC Managed Care – PPO | Admitting: Sports Medicine

## 2022-10-02 DIAGNOSIS — R21 Rash and other nonspecific skin eruption: Secondary | ICD-10-CM | POA: Diagnosis not present

## 2022-10-02 DIAGNOSIS — E119 Type 2 diabetes mellitus without complications: Secondary | ICD-10-CM | POA: Diagnosis not present

## 2022-10-02 LAB — POCT GLYCOSYLATED HEMOGLOBIN (HGB A1C): Hemoglobin A1C: 5.5 % (ref 4.0–5.6)

## 2022-10-02 MED ORDER — PERMETHRIN 5 % EX CREA: 1.0000 | TOPICAL_CREAM | Freq: Once | CUTANEOUS | 3 refills | Status: AC

## 2022-10-02 MED ORDER — DIPHENHYDRAMINE HCL 25 MG PO TABS: 25.0000 mg | ORAL_TABLET | Freq: Every evening | ORAL | 0 refills | Status: DC | PRN

## 2022-10-02 NOTE — Progress Notes (Signed)
    Procedures performed today:    None.  Independent interpretation of notes and tests performed by another provider:   None.  Brief History, Exam, Impression, and Recommendations:    Skin rash Bobby Saunders, he is having recurrence of a rash all over his body, multiple reddish papules, highly pruritic, he does have some in his first webspace on the hand. No changes in lotions, detergents, creams, foods. No viral symptoms preceding this. Unclear etiology, considering the lesions in the webspaces we will add a course of permethrin, neck down to the toes left on overnight and then washed off, I would also like him to do some Benadryl at night. In insufficient improvement after a couple of weeks I would like to biopsy 1 of these lesions.  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 Ordering diabetic eye exam.    ____________________________________________ Gwen Her. Dianah Field, M.D., ABFM., CAQSM., AME. Primary Care and Sports Medicine [facility]  Adjunct Professor of [employer] [facility] [employer]  Risk manager

## 2022-10-02 NOTE — Assessment & Plan Note (Signed)
[name] returns, he is having recurrence of a rash all over his body, multiple reddish papules, highly pruritic, he does have some in his first webspace on the hand. No changes in lotions, detergents, creams, foods. No viral symptoms preceding this. Unclear etiology, considering the lesions in the webspaces we will add a course of permethrin, neck down to the toes left on overnight and then washed off, I would also like him to do some Benadryl at night. In insufficient improvement after a couple of weeks I would like to biopsy 1 of these lesions.

## 2022-10-02 NOTE — Addendum Note (Signed)
Addended by: Tarri Glenn A on: 10/02/2022 09:36 AM   Modules accepted: Orders

## 2022-10-02 NOTE — Assessment & Plan Note (Addendum)
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 Ordering diabetic eye exam.

## 2022-10-07 ENCOUNTER — Other Ambulatory Visit: Payer: Self-pay | Admitting: Sports Medicine

## 2022-10-07 DIAGNOSIS — M5412 Radiculopathy, cervical region: Secondary | ICD-10-CM

## 2022-10-09 ENCOUNTER — Encounter: Payer: Self-pay | Admitting: Sports Medicine

## 2022-10-09 DIAGNOSIS — E119 Type 2 diabetes mellitus without complications: Secondary | ICD-10-CM

## 2022-10-10 MED ORDER — SEMAGLUTIDE (2 MG/DOSE) 8 MG/3ML ~~LOC~~ SOPN: 2.0000 mg | PEN_INJECTOR | SUBCUTANEOUS | 3 refills | Status: DC

## 2022-10-16 NOTE — Telephone Encounter (Signed)
Currently pending completion. [facility] must call [facility] to update his file with them. Per [facility], there is no payment card on file. Once patient, submits this information, the pharmacy will send out the remaining refills on file for a 90 day rx. Patient was updated of the information provided by [facility]. Per patient, he will call the pharmacy. Direct call back of clinical lead information provided. Patient informed to contact the clinic so that the lead can finish the process with [facility]. Patient was agreeable with the process.

## 2022-10-17 ENCOUNTER — Other Ambulatory Visit: Payer: Self-pay | Admitting: Orthopedic Surgery

## 2022-10-17 NOTE — Telephone Encounter (Addendum)
Patient's wife left a vm msg stating that the patient have updated their payment card information. Contacted [facility] at [phone], spoke to [facility], who transferred me to the Clinical Dept Senior Supervisor - Tasia Catchings. Informed that a fax will be sent to override the current rx so that patient may get the remaining refills on file to complete a 90 day supply. Per Tasia Catchings, if the fax is not received, we can contact the Clinical Dept at [phone], Rx Ref #[id number].

## 2022-10-22 NOTE — Telephone Encounter (Signed)
Patient called on 10/19/22 (after hours) requesting an update on his request. Informed that the process has been completed and to expect a call from [facility] regarding the remaining amount of Ozempic to be delivered. No other inquiries from the patient.

## 2022-10-30 ENCOUNTER — Ambulatory Visit: Payer: BC Managed Care – PPO | Admitting: Sports Medicine

## 2022-10-30 NOTE — Progress Notes (Signed)
Surgical Instructions    Your procedure is scheduled on November 07, 2022.  Report to [facility] Main Entrance "A" at 12:30 PM, then check in with the Admitting office.  Call this number if you have problems the morning of surgery:  [phone]   If you have any questions prior to your surgery date call [phone]: Open Monday-Friday 8am-4pm If you experience any cold or flu symptoms such as cough, fever, chills, shortness of breath, etc. between now and your scheduled surgery, please notify us at the above number     Remember:  Do not eat after midnight the night before your surgery  You may drink clear liquids until 12:30pm the afternoon of your surgery.   Clear liquids allowed are: Water, Non-Citrus Juices (without pulp), Carbonated Beverages, Clear Tea, Black Coffee ONLY (NO MILK, CREAM OR POWDERED CREAMER of any kind), and Gatorade   Enhanced Recovery after Surgery for Orthopedics Enhanced Recovery after Surgery is a protocol used to improve the stress on your body and your recovery after surgery.  Patient Instructions   The day of surgery (if you have diabetes):  Drink ONE small 10 oz bottle of water OR G2 GATORADE by 12:30pm the afternoon of surgery This bottle was given to you during your hospital  pre-op appointment visit.  Nothing else to drink after completing the  Small 10 oz bottle of water or G2 Gatorade.         If you have questions, please contact your surgeon's office.     Take these medicines the morning of surgery with A SIP OF WATER:  Atorvastatin (Lipitor) Pantoprazole (Protonix)  If needed: Pregabalin (Lyrica) Tramadol (Ultram)   As of today, STOP taking any Aspirin (unless otherwise instructed by your surgeon) Aleve, Naproxen, Ibuprofen, Motrin, Advil, Goody's, BC's, all herbal medications, fish oil, and all vitamins.  WHAT DO I DO ABOUT MY DIABETES MEDICATION?   Do not take oral diabetes medicines (pills) the morning of surgery.  DO NOT  TAKE METFORMIN (GLUCOPHAGE) THE MORNING OF SURGERY  The day of surgery, do not take other diabetes injectables, including OZEMPIC.  STOP SEMAGLUTIDE (OZEMPIC) 1 WEEK PRIOR TO SURGERY.   HOW TO MANAGE YOUR DIABETES BEFORE AND AFTER SURGERY  Why is it important to control my blood sugar before and after surgery? Improving blood sugar levels before and after surgery helps healing and can limit problems. A way of improving blood sugar control is eating a healthy diet by:  Eating less sugar and carbohydrates  Increasing activity/exercise  Talking with your doctor about reaching your blood sugar goals High blood sugars (greater than 180 mg/dL) can raise your risk of infections and slow your recovery, so you will need to focus on controlling your diabetes during the weeks before surgery. Make sure that the doctor who takes care of your diabetes knows about your planned surgery including the date and location.  How do I manage my blood sugar before surgery? Check your blood sugar at least 4 times a day, starting 2 days before surgery, to make sure that the level is not too high or low.  Check your blood sugar the morning of your surgery when you wake up and every 2 hours until you get to the Short Stay unit.  If your blood sugar is less than 70 mg/dL, you will need to treat for low blood sugar: Do not take insulin. Treat a low blood sugar (less than 70 mg/dL) with  cup of clear juice (cranberry or apple), 4  glucose tablets, OR glucose gel. Recheck blood sugar in 15 minutes after treatment (to make sure it is greater than 70 mg/dL). If your blood sugar is not greater than 70 mg/dL on recheck, call [phone] for further instructions. Report your blood sugar to the short stay nurse when you get to Short Stay.  If you are admitted to the hospital after surgery: Your blood sugar will be checked by the staff and you will probably be given insulin after surgery (instead of oral diabetes medicines)  to make sure you have good blood sugar levels. The goal for blood sugar control after surgery is 80-180 mg/dL.           Do not wear jewelry  Do not wear lotions, powders, cologne or deodorant. Do not shave 48 hours prior to surgery.  Men may shave face and neck. Do not bring valuables to the hospital.   [facility] is not responsible for any belongings or valuables.    Do NOT Smoke (Tobacco/Vaping)  24 hours prior to your procedure  If you use a CPAP at night, you may bring your mask for your overnight stay.   Contacts, glasses, hearing aids, dentures or partials may not be worn into surgery, please bring cases for these belongings   For patients admitted to the hospital, discharge time will be determined by your treatment team.   Patients discharged the day of surgery will not be allowed to drive home, and someone needs to stay with them for 24 hours.   SURGICAL WAITING ROOM VISITATION Patients having surgery or a procedure may have no more than 2 support people in the waiting area - these visitors may rotate.   Children under the age of 59 must have an adult with them who is not the patient. If the patient needs to stay at the hospital during part of their recovery, the visitor guidelines for inpatient rooms apply. Pre-op nurse will coordinate an appropriate time for 1 support person to accompany patient in pre-op.  This support person may not rotate.   Please refer to [email or web address] for the visitor guidelines for Inpatients (after your surgery is over and you are in a regular room).    Special instructions:    Oral Hygiene is also important to reduce your risk of infection.  Remember - BRUSH YOUR TEETH THE MORNING OF SURGERY WITH YOUR REGULAR TOOTHPASTE   [place]- Preparing For Surgery  Before surgery, you can play an important role. Because skin is not sterile, your skin needs to be as free of germs as possible.  You can reduce the number of germs on your skin by washing with CHG (chlorahexidine gluconate) Soap before surgery.  CHG is an antiseptic cleaner which kills germs and bonds with the skin to continue killing germs even after washing.     Please do not use if you have an allergy to CHG or antibacterial soaps. If your skin becomes reddened/irritated stop using the CHG.  Do not shave (including legs and underarms) for at least 48 hours prior to first CHG shower. It is OK to shave your face.  Please follow these instructions carefully.     Shower the NIGHT BEFORE SURGERY and the MORNING OF SURGERY with CHG Soap.   If you chose to wash your hair, wash your hair first as usual with your normal shampoo. After you shampoo, rinse your hair and body thoroughly to remove the shampoo.  Then [employer] and genitals (private parts) with your normal soap  and rinse thoroughly to remove soap.  After that Use CHG Soap as you would any other liquid soap. You can apply CHG directly to the skin and wash gently with a scrungie or a clean washcloth.   Apply the CHG Soap to your body ONLY FROM THE NECK DOWN.  Do not use on open wounds or open sores. Avoid contact with your eyes, ears, mouth and genitals (private parts). Wash Face and genitals (private parts)  with your normal soap.   Wash thoroughly, paying special attention to the area where your surgery will be performed.  Thoroughly rinse your body with warm water from the neck down.  DO NOT shower/wash with your normal soap after using and rinsing off the CHG Soap.  Pat yourself dry with a CLEAN TOWEL.  Wear CLEAN PAJAMAS to bed the night before surgery  Place CLEAN SHEETS on your bed the night before your surgery  DO NOT SLEEP WITH PETS.   Day of Surgery:  Take a shower with CHG soap. Wear Clean/Comfortable clothing the morning of surgery Do not apply any deodorants/lotions.   Remember to brush your teeth WITH YOUR REGULAR TOOTHPASTE.    If you  received a COVID test during your pre-op visit, it is requested that you wear a mask when out in public, stay away from anyone that may not be feeling well, and notify your surgeon if you develop symptoms. If you have been in contact with anyone that has tested positive in the last 10 days, please notify your surgeon.    Please read over the following fact sheets that you were given.

## 2022-10-31 ENCOUNTER — Inpatient Hospital Stay (HOSPITAL_COMMUNITY)
Admission: RE | Admit: 2022-10-31 | Discharge: 2022-10-31 | Disposition: A | Discharge status: Home / Self Care | Payer: BC Managed Care – PPO | Source: Ambulatory Visit

## 2022-10-31 NOTE — Progress Notes (Signed)
Patient had a 8 am PAT appointment scheduled today. Called patient at 0815 to see if he is coming to appointment. Patient stated he got a text and a phone call from [facility] stating that his visit had been changed to a virtual visit and that someone would reach out to him at 0745 am. Informed patient that this RN is not aware of text messages sent from PAT. PAT scheduler made aware and patient rescheduled for Monday 12/4 at 0800 am. Patient is aware that this is an in person visit.

## 2022-11-05 ENCOUNTER — Encounter (HOSPITAL_COMMUNITY): Payer: Self-pay

## 2022-11-05 ENCOUNTER — Other Ambulatory Visit: Payer: Self-pay

## 2022-11-05 ENCOUNTER — Encounter (HOSPITAL_COMMUNITY)
Admission: RE | Admit: 2022-11-05 | Discharge: 2022-11-05 | Disposition: A | Discharge status: Home / Self Care | Payer: BC Managed Care – PPO | Source: Ambulatory Visit | Attending: Orthopedic Surgery | Admitting: Orthopedic Surgery

## 2022-11-05 VITALS — BP 134/94 | Temp 97.9°F | Resp 18 | Ht 70.0 in | Wt 270.5 lb

## 2022-11-05 DIAGNOSIS — Z87891 Personal history of nicotine dependence: Secondary | ICD-10-CM | POA: Insufficient documentation

## 2022-11-05 DIAGNOSIS — G4733 Obstructive sleep apnea (adult) (pediatric): Secondary | ICD-10-CM | POA: Insufficient documentation

## 2022-11-05 DIAGNOSIS — M48061 Spinal stenosis, lumbar region without neurogenic claudication: Secondary | ICD-10-CM | POA: Insufficient documentation

## 2022-11-05 DIAGNOSIS — Z01818 Encounter for other preprocedural examination: Secondary | ICD-10-CM | POA: Insufficient documentation

## 2022-11-05 DIAGNOSIS — E119 Type 2 diabetes mellitus without complications: Secondary | ICD-10-CM | POA: Insufficient documentation

## 2022-11-05 DIAGNOSIS — M79605 Pain in left leg: Secondary | ICD-10-CM | POA: Insufficient documentation

## 2022-11-05 DIAGNOSIS — M79604 Pain in right leg: Secondary | ICD-10-CM | POA: Insufficient documentation

## 2022-11-05 HISTORY — DX: Type 2 diabetes mellitus without complications: E11.9

## 2022-11-05 LAB — TYPE AND SCREEN
ABO/RH(D): A POS
Antibody Screen: NEGATIVE

## 2022-11-05 LAB — CBC
HCT: 49.9 % (ref 39.0–52.0)
Hemoglobin: 17.2 g/dL — ABNORMAL HIGH (ref 13.0–17.0)
MCH: 31.3 pg (ref 26.0–34.0)
MCHC: 34.5 g/dL (ref 30.0–36.0)
MCV: 90.9 fL (ref 80.0–100.0)
Platelets: 217 10*3/uL (ref 150–400)
RBC: 5.49 MIL/uL (ref 4.22–5.81)
RDW: 12.9 % (ref 11.5–15.5)
WBC: 7.4 10*3/uL (ref 4.0–10.5)
nRBC: 0 % (ref 0.0–0.2)

## 2022-11-05 LAB — BASIC METABOLIC PANEL
Anion gap: 7 (ref 5–15)
BUN: 13 mg/dL (ref 6–20)
CO2: 27 mmol/L (ref 22–32)
Calcium: 9.6 mg/dL (ref 8.9–10.3)
Chloride: 106 mmol/L (ref 98–111)
Creatinine, Ser: 1.16 mg/dL (ref 0.61–1.24)
GFR, Estimated: >60 mL/min (ref >60)
Glucose, Bld: 111 mg/dL — ABNORMAL HIGH (ref 70–99)
Potassium: 4.5 mmol/L (ref 3.5–5.1)
Sodium: 140 mmol/L (ref 135–145)

## 2022-11-05 LAB — SURGICAL PCR SCREEN
MRSA, PCR: NEGATIVE
Staphylococcus aureus: NEGATIVE

## 2022-11-05 LAB — GLUCOSE, CAPILLARY: Glucose-Capillary: 109 mg/dL — ABNORMAL HIGH (ref 70–99)

## 2022-11-05 NOTE — Progress Notes (Addendum)
PCP - Rodney Langton Cardiologist - Denies  PPM/ICD - Denies Device Orders -  Rep Notified -   Chest x-ray - NI EKG - 11/05/22 Stress Test - Denies ECHO - Denies Cardiac Cath - Denies  Sleep Study - Has OSA CPAP - Nightly  DM - Type II CBG at PAT app 109 Fasting Blood Sugar - 99-110 Checks Blood Sugar _daily  Last dose of GLP1 agonist-  Ozempic GLP1 instructions: Patient has not taken in more than one week  Blood Thinner Instructions:Denies Aspirin Instructions:Denies  ERAS Protcol -Yes PRE-SURGERY  G2- given   COVID TEST- NI   Anesthesia review: Yes abnormal EKG. (FYI - Patient did have a lot of hair on his chest)  Patient denies shortness of breath, fever, cough and chest pain at PAT appointment   All instructions explained to the patient, with a verbal understanding of the material. Patient agrees to go over the instructions while at home for a better understanding.  The opportunity to ask questions was provided.

## 2022-11-06 NOTE — Anesthesia Preprocedure Evaluation (Signed)
Anesthesia Evaluation  Patient identified by MRN, date of birth, ID band Patient awake    Reviewed: Allergy & Precautions, NPO status , Patient's Chart, lab work & pertinent test results  History of Anesthesia Complications Negative for: history of anesthetic complications  Airway Mallampati: II  TM Distance: >3 FB Neck ROM: Full    Dental no notable dental hx. (+) Dental Advisory Given   Pulmonary sleep apnea and Continuous Positive Airway Pressure Ventilation , former smoker   Pulmonary exam normal        Cardiovascular negative cardio ROS Normal cardiovascular exam     Neuro/Psych negative neurological ROS     GI/Hepatic negative GI ROS, Neg liver ROS,,,  Endo/Other  diabetes    Renal/GU negative Renal ROS     Musculoskeletal negative musculoskeletal ROS (+)    Abdominal   Peds  Hematology negative hematology ROS (+)   Anesthesia Other Findings   Reproductive/Obstetrics                             Anesthesia Physical Anesthesia Plan  ASA: 3  Anesthesia Plan: General   Post-op Pain Management: Ketamine IV* and Tylenol PO (pre-op)*   Induction:   PONV Risk Score and Plan: 3 and Ondansetron, Dexamethasone and Midazolam  Airway Management Planned: Oral ETT  Additional Equipment:   Intra-op Plan:   Post-operative Plan: Extubation in OR  Informed Consent: I have reviewed the patients History and Physical, chart, labs and discussed the procedure including the risks, benefits and alternatives for the proposed anesthesia with the patient or authorized representative who has indicated his/her understanding and acceptance.     Dental advisory given  Plan Discussed with: Anesthesiologist and CRNA  Anesthesia Plan Comments: (See PAT note written 11/06/2022 by Shonna Chock, PA-C.  )       Anesthesia Quick Evaluation

## 2022-11-06 NOTE — Progress Notes (Signed)
Anesthesia Chart Review:  Case: [id number] Date/Time: 11/07/22 1335   Procedures:      LEFT-SIDED LUMBAR 3- LUMBAR 4 LATERAL INTERBODY FUSION WITH INSTRUMENTATION AND ALLOGRAFT. (Left)     POSTERIOR SPINAL FUSION LUMBAR 3 - LUMBAR 4 WITH INSTRUMENTATION AND ALLOGRAFT   Anesthesia type: General   Pre-op diagnosis: Bilateral buttock and leg pain, with an MRI notable for moderate stenosis at L3-L4, the level of his previous central laminectomy   [facility]: [facility] ROOM 05 / [facility]   Surgeons: Estill Bamberg, MD       DISCUSSION: Patient is a 56 year old male scheduled for the above procedure. He was initially scheduled for PAT on 10/31/22, but he did not show because he thought it was changed to a virtual visit, so rescheduled for 11/05/22.   History includes former smoker (quit 05/05/05), DM2, OSA (uses CPAP), cholecystectomy (01/03/22 for acute cholecystitis with gangrene), spinal surgery (L3-4 laminectomy 05/13/14).  A1c in October was 5.5%. He reported Ozempic has been held for > 1 week.   No known CAD/CHF or arrhythmia. 11/05/22 EKG showed NSR, T wave inversion in lateral leads (mainly V5-6 with more non-specific changes in other lateral leads). Staff reported his chest was hairy and difficulty with placing leads. Most recent comparison EKG is from 05/05/14 and showed NSR, no significant ST/T wave abnormality. He did not have a preoperative EKG prior to 01/03/22 cholecystectomy which was done for acute cholecystitis. I called and spoke with Bobby Saunders. He denied any CV symptoms including chest pain, SOB, edema, palpitations, syncope. 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 He remains active despite his back pain--says pain is actually worse with being still. He says he is able to go up a flight of stairs, was using a chain saw for several hours earlier this week, and cleaning out roof  cutters just yesterday. He is not limited in walking either, and is able to go camping and shopping with his wife. He is without CV symptoms and reports METS > 4. I would anticipate that he can proceed as planned based on review and discussion/input with anesthesiologist Bobby Junior, MD. Assigned anesthesiologist will evaluate on the day of surgery and definitive anesthesia plan at that time.     VS: BP (!) 134/94   Temp 36.6 C (Oral)   Resp 18   Ht 5\' 10"  (1.778 m)   Wt 122.7 kg   SpO2 95%   BMI 38.81 kg/m    PROVIDERS: , MD is PCP    LABS: Labs reviewed: Acceptable for surgery. POCT A1c 5.5% on 09/04/22. (all labs ordered are listed, but only abnormal results are displayed)  Labs Reviewed  GLUCOSE, CAPILLARY - Abnormal; Notable for the following components:      Result Value   Glucose-Capillary 109 (*)    All other components within normal limits  BASIC METABOLIC PANEL - Abnormal; Notable for the following components:   Glucose, Bld 111 (*)    All other components within normal limits  CBC - Abnormal; Notable for the following components:   Hemoglobin 17.2 (*)    All other components within normal limits  SURGICAL PCR SCREEN  TYPE AND SCREEN    EKG: EKG 11/05/22: Normal sinus rhythm T wave abnormality, consider lateral ischemia Abnormal ECG When compared with ECG of 05-May-2014 09:16, - See DISCUSSION.   CV: N/A  Past Medical History:  Diagnosis Date   Diabetes mellitus without complication (HCC)    type II   Sleep apnea    cpap 6 yrs   Vitamin D insufficiency 01/07/2018    Past Surgical History:  Procedure Laterality Date   CHOLECYSTECTOMY N/A 01/03/2022   Procedure: LAPAROSCOPIC CHOLECYSTECTOMY;  Surgeon: Quentin Ore, MD;  Location: [facility];  Service: General;  Laterality: N/A;   ELBOW SURGERY Bilateral 01 last   numerous lft , one right   LUMBAR LAMINECTOMY/DECOMPRESSION MICRODISCECTOMY N/A 05/13/2014   Procedure:  LUMBAR LAMINECTOMY/DECOMPRESSION MICRODISCECTOMY;  Surgeon: Emilee Hero, MD;  Location: [facility];  Service: Orthopedics;  Laterality: N/A;  Lumbar 3-4 decompression    MEDICATIONS:  AMBULATORY NON FORMULARY MEDICATION   atorvastatin (LIPITOR) 40 MG tablet   diphenhydrAMINE (BENADRYL ALLERGY) 25 MG tablet   glucose blood (ACCU-CHEK GUIDE) test strip   metFORMIN (GLUCOPHAGE) 1000 MG tablet   pantoprazole (PROTONIX) 40 MG tablet   pregabalin (LYRICA) 75 MG capsule   Semaglutide, 2 MG/DOSE, 8 MG/3ML SOPN   traMADol (ULTRAM) 50 MG tablet   No current facility-administered medications for this encounter.    Shonna Chock, PA-C Surgical Short Stay/Anesthesiology [facility] Phone [phone] [facility] Phone [phone] 11/06/2022 12:29 PM

## 2022-11-07 ENCOUNTER — Inpatient Hospital Stay (HOSPITAL_COMMUNITY): Payer: BC Managed Care – PPO

## 2022-11-07 ENCOUNTER — Inpatient Hospital Stay (HOSPITAL_COMMUNITY): Payer: BC Managed Care – PPO | Admitting: Vascular Surgery

## 2022-11-07 ENCOUNTER — Other Ambulatory Visit: Payer: Self-pay

## 2022-11-07 ENCOUNTER — Inpatient Hospital Stay: Payer: Self-pay

## 2022-11-07 ENCOUNTER — Encounter (HOSPITAL_COMMUNITY)
Admission: RE | Disposition: A | Discharge status: Home / Self Care | Payer: Self-pay | Source: Ambulatory Visit | Attending: Orthopedic Surgery

## 2022-11-07 ENCOUNTER — Inpatient Hospital Stay (HOSPITAL_COMMUNITY): Payer: BC Managed Care – PPO | Admitting: Certified Registered Nurse Anesthetist

## 2022-11-07 ENCOUNTER — Inpatient Hospital Stay (HOSPITAL_COMMUNITY)
Admission: RE | Admit: 2022-11-07 | Discharge: 2022-11-08 | DRG: 455 | Disposition: A | Discharge status: Home / Self Care | Payer: BC Managed Care – PPO | Attending: Orthopedic Surgery | Admitting: Orthopedic Surgery

## 2022-11-07 ENCOUNTER — Encounter (HOSPITAL_COMMUNITY): Payer: Self-pay | Admitting: Orthopedic Surgery

## 2022-11-07 DIAGNOSIS — Z9049 Acquired absence of other specified parts of digestive tract: Secondary | ICD-10-CM | POA: Diagnosis not present

## 2022-11-07 DIAGNOSIS — Z83438 Family history of other disorder of lipoprotein metabolism and other lipidemia: Secondary | ICD-10-CM | POA: Diagnosis not present

## 2022-11-07 DIAGNOSIS — Z800 Family history of malignant neoplasm of digestive organs: Secondary | ICD-10-CM

## 2022-11-07 DIAGNOSIS — Z01818 Encounter for other preprocedural examination: Secondary | ICD-10-CM | POA: Diagnosis not present

## 2022-11-07 DIAGNOSIS — Z888 Allergy status to other drugs, medicaments and biological substances status: Secondary | ICD-10-CM

## 2022-11-07 DIAGNOSIS — M5416 Radiculopathy, lumbar region: Secondary | ICD-10-CM | POA: Diagnosis present

## 2022-11-07 DIAGNOSIS — Z87891 Personal history of nicotine dependence: Secondary | ICD-10-CM | POA: Diagnosis not present

## 2022-11-07 DIAGNOSIS — E119 Type 2 diabetes mellitus without complications: Secondary | ICD-10-CM | POA: Diagnosis present

## 2022-11-07 DIAGNOSIS — Z79899 Other long term (current) drug therapy: Secondary | ICD-10-CM | POA: Diagnosis not present

## 2022-11-07 DIAGNOSIS — Z8249 Family history of ischemic heart disease and other diseases of the circulatory system: Secondary | ICD-10-CM

## 2022-11-07 DIAGNOSIS — M4800 Spinal stenosis, site unspecified: Secondary | ICD-10-CM | POA: Diagnosis present

## 2022-11-07 DIAGNOSIS — M48061 Spinal stenosis, lumbar region without neurogenic claudication: Secondary | ICD-10-CM | POA: Diagnosis present

## 2022-11-07 DIAGNOSIS — Z7984 Long term (current) use of oral hypoglycemic drugs: Secondary | ICD-10-CM | POA: Diagnosis not present

## 2022-11-07 DIAGNOSIS — G4733 Obstructive sleep apnea (adult) (pediatric): Secondary | ICD-10-CM | POA: Diagnosis present

## 2022-11-07 HISTORY — PX: ANTERIOR LAT LUMBAR FUSION: SHX1168

## 2022-11-07 LAB — GLUCOSE, CAPILLARY
Glucose-Capillary: 114 mg/dL — ABNORMAL HIGH (ref 70–99)
Glucose-Capillary: 96 mg/dL (ref 70–99)
Glucose-Capillary: 108 mg/dL — ABNORMAL HIGH (ref 70–99)
Glucose-Capillary: 112 mg/dL — ABNORMAL HIGH (ref 70–99)

## 2022-11-07 SURGERY — ANTERIOR LATERAL LUMBAR FUSION 1 LEVEL
Anesthesia: General | Site: Back

## 2022-11-07 MED ORDER — THROMBIN 20000 UNITS EX SOLR
CUTANEOUS | Status: AC
  Filled 2022-11-07: qty 20000

## 2022-11-07 MED ORDER — EPHEDRINE 5 MG/ML INJ
INTRAVENOUS | Status: AC
  Filled 2022-11-07: qty 10

## 2022-11-07 MED ORDER — CEFAZOLIN SODIUM-DEXTROSE 2-4 GM/100ML-% IV SOLN
2.0000 g | Freq: Three times a day (TID) | INTRAVENOUS | Status: AC
  Administered 2022-11-07 – 2022-11-08 (×2): 2 g via INTRAVENOUS
  Filled 2022-11-07 (×2): qty 100

## 2022-11-07 MED ORDER — DOCUSATE SODIUM 100 MG PO CAPS
100.0000 mg | ORAL_CAPSULE | Freq: Two times a day (BID) | ORAL | Status: DC
  Administered 2022-11-07 – 2022-11-08 (×2): 100 mg via ORAL
  Filled 2022-11-07 (×2): qty 1

## 2022-11-07 MED ORDER — VASOPRESSIN 20 UNIT/ML IV SOLN
INTRAVENOUS | Status: AC
  Filled 2022-11-07: qty 1

## 2022-11-07 MED ORDER — KETAMINE HCL-SODIUM CHLORIDE 100-0.9 MG/10ML-% IV SOSY: PREFILLED_SYRINGE | INTRAVENOUS | Status: DC | PRN

## 2022-11-07 MED ORDER — PROPOFOL 10 MG/ML IV BOLUS
INTRAVENOUS | Status: AC
  Filled 2022-11-07: qty 20

## 2022-11-07 MED ORDER — SODIUM CHLORIDE 0.9 % IV SOLN: 250.0000 mL | INTRAVENOUS | Status: DC

## 2022-11-07 MED ORDER — SENNOSIDES-DOCUSATE SODIUM 8.6-50 MG PO TABS: 1.0000 | ORAL_TABLET | Freq: Every evening | ORAL | Status: DC | PRN

## 2022-11-07 MED ORDER — BISACODYL 5 MG PO TBEC: 5.0000 mg | DELAYED_RELEASE_TABLET | Freq: Every day | ORAL | Status: DC | PRN

## 2022-11-07 MED ORDER — FLEET ENEMA 7-19 GM/118ML RE ENEM: 1.0000 | ENEMA | Freq: Once | RECTAL | Status: DC | PRN

## 2022-11-07 MED ORDER — CHLORHEXIDINE GLUCONATE 0.12 % MT SOLN
15.0000 mL | Freq: Once | OROMUCOSAL | Status: AC
  Administered 2022-11-07: 15 mL via OROMUCOSAL
  Filled 2022-11-07: qty 15

## 2022-11-07 MED ORDER — ACETAMINOPHEN 650 MG RE SUPP: 650.0000 mg | RECTAL | Status: DC | PRN

## 2022-11-07 MED ORDER — PHENYLEPHRINE HCL-NACL 20-0.9 MG/250ML-% IV SOLN
INTRAVENOUS | Status: DC | PRN
  Administered 2022-11-07: 25 ug/min via INTRAVENOUS

## 2022-11-07 MED ORDER — MENTHOL 3 MG MT LOZG: 1.0000 | LOZENGE | OROMUCOSAL | Status: DC | PRN

## 2022-11-07 MED ORDER — PROPOFOL 10 MG/ML IV BOLUS
INTRAVENOUS | Status: DC | PRN
  Administered 2022-11-07: 200 mg via INTRAVENOUS
  Administered 2022-11-07: 20 mg via INTRAVENOUS
  Administered 2022-11-07: 30 mg via INTRAVENOUS

## 2022-11-07 MED ORDER — ZOLPIDEM TARTRATE 5 MG PO TABS: 5.0000 mg | ORAL_TABLET | Freq: Every evening | ORAL | Status: DC | PRN

## 2022-11-07 MED ORDER — ATORVASTATIN CALCIUM 40 MG PO TABS
40.0000 mg | ORAL_TABLET | Freq: Every day | ORAL | Status: DC
  Administered 2022-11-07: 40 mg via ORAL
  Filled 2022-11-07: qty 1

## 2022-11-07 MED ORDER — EPHEDRINE SULFATE-NACL 50-0.9 MG/10ML-% IV SOSY
PREFILLED_SYRINGE | INTRAVENOUS | Status: DC | PRN
  Administered 2022-11-07: 10 mg via INTRAVENOUS
  Administered 2022-11-07: 15 mg via INTRAVENOUS
  Administered 2022-11-07 (×2): 5 mg via INTRAVENOUS

## 2022-11-07 MED ORDER — CEFAZOLIN SODIUM-DEXTROSE 2-3 GM-%(50ML) IV SOLR
INTRAVENOUS | Status: DC | PRN
  Administered 2022-11-07: 2 g via INTRAVENOUS

## 2022-11-07 MED ORDER — ONDANSETRON HCL 4 MG PO TABS: 4.0000 mg | ORAL_TABLET | Freq: Four times a day (QID) | ORAL | Status: DC | PRN

## 2022-11-07 MED ORDER — ONDANSETRON HCL 4 MG/2ML IJ SOLN: 4.0000 mg | Freq: Four times a day (QID) | INTRAMUSCULAR | Status: DC | PRN

## 2022-11-07 MED ORDER — SODIUM CHLORIDE 0.9% FLUSH: 3.0000 mL | Freq: Two times a day (BID) | INTRAVENOUS | Status: DC

## 2022-11-07 MED ORDER — OXYCODONE-ACETAMINOPHEN 5-325 MG PO TABS
1.0000 | ORAL_TABLET | ORAL | Status: DC | PRN
  Administered 2022-11-07 – 2022-11-08 (×4): 2 via ORAL
  Filled 2022-11-07 (×4): qty 2

## 2022-11-07 MED ORDER — ORAL CARE MOUTH RINSE: 15.0000 mL | Freq: Once | OROMUCOSAL | Status: AC

## 2022-11-07 MED ORDER — THROMBIN 20000 UNITS EX SOLR
CUTANEOUS | Status: DC | PRN
  Administered 2022-11-07: 20 mL via TOPICAL

## 2022-11-07 MED ORDER — FENTANYL CITRATE (PF) 100 MCG/2ML IJ SOLN
INTRAMUSCULAR | Status: AC
  Administered 2022-11-07: 50 ug via INTRAVENOUS
  Filled 2022-11-07: qty 2

## 2022-11-07 MED ORDER — SCOPOLAMINE 1 MG/3DAYS TD PT72
MEDICATED_PATCH | TRANSDERMAL | Status: AC
  Filled 2022-11-07: qty 1

## 2022-11-07 MED ORDER — PANTOPRAZOLE SODIUM 40 MG PO TBEC
40.0000 mg | DELAYED_RELEASE_TABLET | Freq: Every day | ORAL | Status: DC
  Administered 2022-11-07 – 2022-11-08 (×2): 40 mg via ORAL
  Filled 2022-11-07 (×2): qty 1

## 2022-11-07 MED ORDER — GLYCOPYRROLATE PF 0.2 MG/ML IJ SOSY
PREFILLED_SYRINGE | INTRAMUSCULAR | Status: DC | PRN
  Administered 2022-11-07: .2 mg via INTRAVENOUS
  Administered 2022-11-07 (×2): .1 mg via INTRAVENOUS

## 2022-11-07 MED ORDER — METHOCARBAMOL 500 MG PO TABS
500.0000 mg | ORAL_TABLET | Freq: Four times a day (QID) | ORAL | Status: DC | PRN
  Administered 2022-11-07: 500 mg via ORAL
  Administered 2022-11-08: 1000 mg via ORAL
  Filled 2022-11-07: qty 2
  Filled 2022-11-07: qty 1

## 2022-11-07 MED ORDER — PHENOL 1.4 % MT LIQD: 1.0000 | OROMUCOSAL | Status: DC | PRN

## 2022-11-07 MED ORDER — MORPHINE SULFATE (PF) 2 MG/ML IV SOLN: 1.0000 mg | INTRAVENOUS | Status: DC | PRN

## 2022-11-07 MED ORDER — FENTANYL CITRATE (PF) 100 MCG/2ML IJ SOLN
25.0000 ug | INTRAMUSCULAR | Status: DC | PRN
  Administered 2022-11-07: 50 ug via INTRAVENOUS

## 2022-11-07 MED ORDER — SUCCINYLCHOLINE CHLORIDE 200 MG/10ML IV SOSY
PREFILLED_SYRINGE | INTRAVENOUS | Status: DC | PRN
  Administered 2022-11-07: 160 mg via INTRAVENOUS

## 2022-11-07 MED ORDER — SEMAGLUTIDE (2 MG/DOSE) 8 MG/3ML ~~LOC~~ SOPN: 2.0000 mg | PEN_INJECTOR | SUBCUTANEOUS | Status: DC

## 2022-11-07 MED ORDER — PROPOFOL 500 MG/50ML IV EMUL
INTRAVENOUS | Status: DC | PRN
  Administered 2022-11-07: 100 ug/kg/min via INTRAVENOUS

## 2022-11-07 MED ORDER — FENTANYL CITRATE (PF) 250 MCG/5ML IJ SOLN
INTRAMUSCULAR | Status: DC | PRN
  Administered 2022-11-07: 100 ug via INTRAVENOUS
  Administered 2022-11-07 (×2): 50 ug via INTRAVENOUS
  Administered 2022-11-07 (×2): 25 ug via INTRAVENOUS

## 2022-11-07 MED ORDER — METHOCARBAMOL 1000 MG/10ML IJ SOLN: 500.0000 mg | Freq: Four times a day (QID) | INTRAVENOUS | Status: DC | PRN

## 2022-11-07 MED ORDER — HYDROMORPHONE HCL 1 MG/ML IJ SOLN: 0.2500 mg | INTRAMUSCULAR | Status: DC | PRN

## 2022-11-07 MED ORDER — VASOPRESSIN 20 UNIT/ML IV SOLN
INTRAVENOUS | Status: DC | PRN
  Administered 2022-11-07 (×2): 1 [IU] via INTRAVENOUS

## 2022-11-07 MED ORDER — MIDAZOLAM HCL 2 MG/2ML IJ SOLN
INTRAMUSCULAR | Status: AC
  Filled 2022-11-07: qty 2

## 2022-11-07 MED ORDER — MIDAZOLAM HCL 2 MG/2ML IJ SOLN
INTRAMUSCULAR | Status: DC | PRN
  Administered 2022-11-07: 2 mg via INTRAVENOUS

## 2022-11-07 MED ORDER — BUPIVACAINE LIPOSOME 1.3 % IJ SUSP
INTRAMUSCULAR | Status: DC | PRN
  Administered 2022-11-07: 11 mL

## 2022-11-07 MED ORDER — BUPIVACAINE-EPINEPHRINE (PF) 0.25% -1:200000 IJ SOLN
INTRAMUSCULAR | Status: AC
  Filled 2022-11-07: qty 30

## 2022-11-07 MED ORDER — PREGABALIN 75 MG PO CAPS: 75.0000 mg | ORAL_CAPSULE | Freq: Three times a day (TID) | ORAL | Status: DC | PRN

## 2022-11-07 MED ORDER — ACETAMINOPHEN 500 MG PO TABS
1000.0000 mg | ORAL_TABLET | Freq: Once | ORAL | Status: AC
  Administered 2022-11-07: 1000 mg via ORAL
  Filled 2022-11-07: qty 2

## 2022-11-07 MED ORDER — ALUM & MAG HYDROXIDE-SIMETH 200-200-20 MG/5ML PO SUSP: 30.0000 mL | Freq: Four times a day (QID) | ORAL | Status: DC | PRN

## 2022-11-07 MED ORDER — HYDROCODONE-ACETAMINOPHEN 5-325 MG PO TABS: 1.0000 | ORAL_TABLET | ORAL | Status: DC | PRN

## 2022-11-07 MED ORDER — BUPIVACAINE LIPOSOME 1.3 % IJ SUSP
INTRAMUSCULAR | Status: AC
  Filled 2022-11-07: qty 20

## 2022-11-07 MED ORDER — ACETAMINOPHEN 325 MG PO TABS: 650.0000 mg | ORAL_TABLET | ORAL | Status: DC | PRN

## 2022-11-07 MED ORDER — LACTATED RINGERS IV SOLN: INTRAVENOUS | Status: DC

## 2022-11-07 MED ORDER — ALBUMIN HUMAN 5 % IV SOLN: INTRAVENOUS | Status: DC | PRN

## 2022-11-07 MED ORDER — AMISULPRIDE (ANTIEMETIC) 5 MG/2ML IV SOLN: 10.0000 mg | Freq: Once | INTRAVENOUS | Status: DC | PRN

## 2022-11-07 MED ORDER — ONDANSETRON HCL 4 MG/2ML IJ SOLN
INTRAMUSCULAR | Status: DC | PRN
  Administered 2022-11-07: 4 mg via INTRAVENOUS

## 2022-11-07 MED ORDER — POVIDONE-IODINE 7.5 % EX SOLN: Freq: Once | CUTANEOUS | Status: DC

## 2022-11-07 MED ORDER — KETAMINE HCL-SODIUM CHLORIDE 100-0.9 MG/10ML-% IV SOSY
PREFILLED_SYRINGE | INTRAVENOUS | Status: DC | PRN
  Administered 2022-11-07: 30 mg via INTRAVENOUS
  Administered 2022-11-07: 10 mg via INTRAVENOUS

## 2022-11-07 MED ORDER — METFORMIN HCL 500 MG PO TABS
1000.0000 mg | ORAL_TABLET | Freq: Two times a day (BID) | ORAL | Status: DC
  Administered 2022-11-08: 1000 mg via ORAL
  Filled 2022-11-07: qty 2

## 2022-11-07 MED ORDER — SODIUM CHLORIDE 0.9% FLUSH: 3.0000 mL | INTRAVENOUS | Status: DC | PRN

## 2022-11-07 MED ORDER — FENTANYL CITRATE (PF) 250 MCG/5ML IJ SOLN
INTRAMUSCULAR | Status: AC
  Filled 2022-11-07: qty 5

## 2022-11-07 MED ORDER — 0.9 % SODIUM CHLORIDE (POUR BTL) OPTIME
TOPICAL | Status: DC | PRN
  Administered 2022-11-07: 1000 mL

## 2022-11-07 MED ORDER — PROMETHAZINE HCL 25 MG/ML IJ SOLN: 6.2500 mg | INTRAMUSCULAR | Status: DC | PRN

## 2022-11-07 MED ORDER — KETAMINE HCL 50 MG/5ML IJ SOSY
PREFILLED_SYRINGE | INTRAMUSCULAR | Status: AC
  Filled 2022-11-07: qty 5

## 2022-11-07 MED ORDER — CEFAZOLIN IN SODIUM CHLORIDE 3-0.9 GM/100ML-% IV SOLN
3.0000 g | INTRAVENOUS | Status: DC
  Filled 2022-11-07: qty 100

## 2022-11-07 MED ORDER — POTASSIUM CHLORIDE IN NACL 20-0.9 MEQ/L-% IV SOLN: INTRAVENOUS | Status: DC

## 2022-11-07 MED ORDER — BUPIVACAINE-EPINEPHRINE 0.25% -1:200000 IJ SOLN
INTRAMUSCULAR | Status: DC | PRN
  Administered 2022-11-07: 16 mL

## 2022-11-07 MED ORDER — HYDROMORPHONE HCL 1 MG/ML IJ SOLN
INTRAMUSCULAR | Status: AC
  Administered 2022-11-07: 0.5 mg via INTRAVENOUS
  Filled 2022-11-07: qty 1

## 2022-11-07 SURGICAL SUPPLY — 97 items
BAG COUNTER SPONGE SURGICOUNT (BAG) ×4 IMPLANT
BENZOIN TINCTURE PRP APPL 2/3 (GAUZE/BANDAGES/DRESSINGS) ×2 IMPLANT
BLADE CLIPPER SURG (BLADE) IMPLANT
BLADE SURG 10 STRL SS (BLADE) ×2 IMPLANT
BONE VIVIGEN FORMABLE 10CC (Bone Implant) ×2 IMPLANT
BUR PRESCISION 1.7 ELITE (BURR) ×2 IMPLANT
BUR ROUND FLUTED 5 RND (BURR) ×2 IMPLANT
BUR ROUND PRECISION 4.0 (BURR) IMPLANT
BUR SABER RD CUTTING 3.0 (BURR) IMPLANT
CLIP SPRING STIM LLIF SAFEOP (CLIP) IMPLANT
CNTNR URN SCR LID CUP LEK RST (MISCELLANEOUS) ×2 IMPLANT
CONT SPEC 4OZ STRL OR WHT (MISCELLANEOUS) ×2
COVER BACK TABLE 80X110 HD (DRAPES) ×2 IMPLANT
COVER MAYO STAND STRL (DRAPES) ×4 IMPLANT
COVER SURGICAL LIGHT HANDLE (MISCELLANEOUS) ×4 IMPLANT
DILATOR INSULATED SAFEOP OVAL (NEUROSURGERY SUPPLIES) IMPLANT
DRAIN CHANNEL 15F RND FF W/TCR (WOUND CARE) IMPLANT
DRAPE C-ARM 42X72 X-RAY (DRAPES) ×4 IMPLANT
DRAPE C-ARMOR (DRAPES) ×2 IMPLANT
DRAPE POUCH INSTRU U-SHP 10X18 (DRAPES) ×4 IMPLANT
DRAPE SURG 17X23 STRL (DRAPES) ×16 IMPLANT
DURAPREP 26ML APPLICATOR (WOUND CARE) ×4 IMPLANT
ELECT BLADE 4.0 EZ CLEAN MEGAD (MISCELLANEOUS)
ELECT BLADE 6.5 EXT (BLADE) ×2 IMPLANT
ELECT CAUTERY BLADE 6.4 (BLADE) ×4 IMPLANT
ELECT KIT SAFEOP SSEP/SURF (KITS) ×2
ELECT REM PT RETURN 9FT ADLT (ELECTROSURGICAL) ×2
ELECTRODE BLDE 4.0 EZ CLN MEGD (MISCELLANEOUS) ×2 IMPLANT
ELECTRODE KT SAFEOP SSEP/SURF (KITS) IMPLANT
ELECTRODE REM PT RTRN 9FT ADLT (ELECTROSURGICAL) ×4 IMPLANT
EVACUATOR SILICONE 100CC (DRAIN) IMPLANT
FILTER STRAW FLUID ASPIR (MISCELLANEOUS) ×2 IMPLANT
GAUZE 4X4 16PLY ~~LOC~~+RFID DBL (SPONGE) ×4 IMPLANT
GAUZE SPONGE 4X4 12PLY STRL (GAUZE/BANDAGES/DRESSINGS) ×2 IMPLANT
GLOVE BIO SURGEON STRL SZ7 (GLOVE) ×6 IMPLANT
GLOVE BIO SURGEON STRL SZ8 (GLOVE) ×4 IMPLANT
GLOVE BIOGEL PI IND STRL 7.0 (GLOVE) ×4 IMPLANT
GLOVE BIOGEL PI IND STRL 8 (GLOVE) ×4 IMPLANT
GLOVE SURG ENC MOIS LTX SZ6.5 (GLOVE) ×6 IMPLANT
GOWN STRL REUS W/ TWL LRG LVL3 (GOWN DISPOSABLE) ×6 IMPLANT
GOWN STRL REUS W/ TWL XL LVL3 (GOWN DISPOSABLE) ×6 IMPLANT
GOWN STRL REUS W/TWL LRG LVL3 (GOWN DISPOSABLE) ×2
GOWN STRL REUS W/TWL XL LVL3 (GOWN DISPOSABLE) ×4
GRAFT BNE MATRIX VG FRMBL L 10 (Bone Implant) IMPLANT
GUIDEWIRE SHARP VIPER II (WIRE) IMPLANT
GUIDEWIRE TROCAR TIP 320 (WIRE) IMPLANT
IV CATH 14GX2 1/4 (CATHETERS) ×4 IMPLANT
KIT ALARA NEURO ACCESS (KITS) IMPLANT
KIT BASIN OR (CUSTOM PROCEDURE TRAY) ×4 IMPLANT
KIT POSITION SURG JACKSON T1 (MISCELLANEOUS) ×2 IMPLANT
KIT TURNOVER KIT B (KITS) ×4 IMPLANT
KNIFE ANNULOTOMY (BLADE) IMPLANT
MARKER SKIN DUAL TIP RULER LAB (MISCELLANEOUS) ×6 IMPLANT
MIX DBX 10CC 35% BONE (Bone Implant) IMPLANT
NDL 18GX1X1/2 (RX/OR ONLY) (NEEDLE) ×2 IMPLANT
NDL 22X1.5 STRL (OR ONLY) (MISCELLANEOUS) ×4 IMPLANT
NDL HYPO 25GX1X1/2 BEV (NEEDLE) ×4 IMPLANT
NDL SPNL 18GX3.5 QUINCKE PK (NEEDLE) ×6 IMPLANT
NEEDLE 18GX1X1/2 (RX/OR ONLY) (NEEDLE) IMPLANT
NEEDLE 22X1.5 STRL (OR ONLY) (MISCELLANEOUS) IMPLANT
NEEDLE HYPO 25GX1X1/2 BEV (NEEDLE) ×2 IMPLANT
NEEDLE SPNL 18GX3.5 QUINCKE PK (NEEDLE) ×2 IMPLANT
NS IRRIG 1000ML POUR BTL (IV SOLUTION) ×6 IMPLANT
PACK LAMINECTOMY ORTHO (CUSTOM PROCEDURE TRAY) ×4 IMPLANT
PACK UNIVERSAL I (CUSTOM PROCEDURE TRAY) ×4 IMPLANT
PAD ARMBOARD 7.5X6 YLW CONV (MISCELLANEOUS) ×8 IMPLANT
PATTIES SURGICAL .5 X1 (DISPOSABLE) ×2 IMPLANT
PATTIES SURGICAL .5X1.5 (GAUZE/BANDAGES/DRESSINGS) ×2 IMPLANT
PROBE BALL TIP LLIF SAFEOP (NEUROSURGERY SUPPLIES) IMPLANT
SCREW SET SINGLE INNER MIS (Screw) IMPLANT
SCREW XTAB POLY VIPER  7X45 (Screw) ×4 IMPLANT
SCREW XTAB POLY VIPER 7X45 (Screw) IMPLANT
SHIM INTRADISCAL LTP WIDE (ORTHOPEDIC DISPOSABLE SUPPLIES) IMPLANT
SPACER TRANSCEND 12X22X60 0D (Spacer) IMPLANT
SPONGE INTESTINAL PEANUT (DISPOSABLE) ×6 IMPLANT
SPONGE SURGIFOAM ABS GEL 100 (HEMOSTASIS) ×2 IMPLANT
SPONGE T-LAP 4X18 ~~LOC~~+RFID (SPONGE) ×2 IMPLANT
STAPLER VISISTAT 35W (STAPLE) ×2 IMPLANT
STRIP CLOSURE SKIN 1/2X4 (GAUZE/BANDAGES/DRESSINGS) ×4 IMPLANT
SURGIFLO W/THROMBIN 8M KIT (HEMOSTASIS) IMPLANT
SUT MNCRL AB 4-0 PS2 18 (SUTURE) ×4 IMPLANT
SUT VIC AB 0 CT1 18XCR BRD 8 (SUTURE) ×4 IMPLANT
SUT VIC AB 0 CT1 8-18 (SUTURE) ×2
SUT VIC AB 1 CT1 18XCR BRD 8 (SUTURE) ×4 IMPLANT
SUT VIC AB 1 CT1 8-18 (SUTURE) ×2
SUT VIC AB 2-0 CT2 18 VCP726D (SUTURE) ×4 IMPLANT
SYR 20ML LL LF (SYRINGE) ×4 IMPLANT
SYR BULB IRRIG 60ML STRL (SYRINGE) ×4 IMPLANT
SYR CONTROL 10ML LL (SYRINGE) ×6 IMPLANT
SYR TB 1ML LUER SLIP (SYRINGE) ×2 IMPLANT
TAP CANN VIPER2 DL 6.0 (TAP) IMPLANT
TAPE CLOTH SURG 6X10 WHT LF (GAUZE/BANDAGES/DRESSINGS) IMPLANT
TOWEL GREEN STERILE (TOWEL DISPOSABLE) ×2 IMPLANT
TOWEL GREEN STERILE FF (TOWEL DISPOSABLE) ×2 IMPLANT
TRAY FOLEY MTR SLVR 16FR STAT (SET/KITS/TRAYS/PACK) ×4 IMPLANT
WATER STERILE IRR 1000ML POUR (IV SOLUTION) ×4 IMPLANT
YANKAUER SUCT BULB TIP NO VENT (SUCTIONS) ×4 IMPLANT

## 2022-11-07 NOTE — Anesthesia Postprocedure Evaluation (Signed)
Anesthesia Post Note  Patient: Bobby Saunders  Procedure(s) Performed: LEFT-SIDED LUMBAR THREE- LUMBAR FOUR LATERAL INTERBODY FUSION WITH INSTRUMENTATION AND ALLOGRAFT. (Left: Back) POSTERIOR SPINAL FUSION LUMBAR THREE - LUMBAR FOUR WITH INSTRUMENTATION AND ALLOGRAFT (Back)     Patient location during evaluation: PACU Anesthesia Type: General Level of consciousness: awake and alert Pain management: pain level controlled Vital Signs Assessment: post-procedure vital signs reviewed and stable Respiratory status: spontaneous breathing, nonlabored ventilation and respiratory function stable Cardiovascular status: blood pressure returned to baseline and stable Postop Assessment: no apparent nausea or vomiting Anesthetic complications: no  No notable events documented.  Last Vitals:  Vitals:   11/07/22 1745 11/07/22 1800  BP: 137/88 (!) 138/90  Pulse: 80 73  Resp: 10 (!) 9  Temp: 36.6 C   SpO2: 96% 95%    Last Pain:  Vitals:   11/07/22 1745  TempSrc:   PainSc: 6                  Ashna Dorough,W. EDMOND

## 2022-11-07 NOTE — H&P (Signed)
PREOPERATIVE H&P  Chief Complaint: Bilateral leg pain  HPI: Bobby Saunders is a 56 y.o. male who presents with ongoing pain in the bilateral legs  MRI reveals recurrent stenosis at L3/4, the level of the patient's previous decompression 8 years ago  Patient has failed multiple forms of conservative care and continues to have pain (see office notes for additional details regarding the patient's full course of treatment)  Past Medical History:  Diagnosis Date   Diabetes mellitus without complication (HCC)    type II   Sleep apnea    cpap 6 yrs   Vitamin D insufficiency 01/07/2018   Past Surgical History:  Procedure Laterality Date   CHOLECYSTECTOMY N/A 01/03/2022   Procedure: LAPAROSCOPIC CHOLECYSTECTOMY;  Surgeon: Quentin Ore, MD;  Location: [facility];  Service: General;  Laterality: N/A;   ELBOW SURGERY Bilateral 01 last   numerous lft , one right   LUMBAR LAMINECTOMY/DECOMPRESSION MICRODISCECTOMY N/A 05/13/2014   Procedure: LUMBAR LAMINECTOMY/DECOMPRESSION MICRODISCECTOMY;  Surgeon: Emilee Hero, MD;  Location: [facility];  Service: Orthopedics;  Laterality: N/A;  Lumbar 3-4 decompression   Social History   Socioeconomic History   Marital status: Married    Spouse name: Community education officer   Number of children: 2   Years of education: Not on file   Highest education level: Not on file  Occupational History   Not on file  Tobacco Use   Smoking status: Former    Packs/day: 1.00    Years: 25.00    Total pack years: 25.00    Types: Cigarettes    Quit date: 05/05/2005    Years since quitting: 17.5   Smokeless tobacco: Former    Quit date: 2021  Vaping Use   Vaping Use: Never used  Substance and Sexual Activity   Alcohol use: No    Alcohol/week: 0.0 standard drinks of alcohol   Drug use: No   Sexual activity: Yes  Other Topics Concern   Not on file  Social History Narrative   Not on file   Social Determinants of Health   Financial Resource Strain: Not on file   Food Insecurity: Not on file  Transportation Needs: Not on file  Physical Activity: Not on file  Stress: Not on file  Social Connections: Not on file   Family History  Problem Relation Age of Onset   Cancer Sister        colon   Hyperlipidemia Mother    Hypertension Mother    Cancer Maternal Uncle    Cancer Maternal Grandmother    Sudden death Neg Hx    Heart attack Neg Hx    Diabetes Neg Hx    No Known Allergies Prior to Admission medications   Medication Sig Start Date End Date Taking? Authorizing Provider  atorvastatin (LIPITOR) 40 MG tablet TAKE 1 TABLET BY MOUTH EVERY DAY 07/23/22  Yes Monica Becton, MD  metFORMIN (GLUCOPHAGE) 1000 MG tablet TAKE 1 TABLET BY MOUTH TWICE A DAY WITH FOOD 07/23/22  Yes Monica Becton, MD  pantoprazole (PROTONIX) 40 MG tablet TAKE 1 TABLET BY MOUTH EVERY DAY 09/25/22  Yes Monica Becton, MD  pregabalin (LYRICA) 75 MG capsule TAKE 1 CAPSULE 3 TIMES A DAY AS NEEDED 03/11/22  Yes Monica Becton, MD  Semaglutide, 2 MG/DOSE, 8 MG/3ML SOPN Inject 2 mg as directed once a week. Patient taking differently: Inject 2 mg as directed every Wednesday. 10/10/22  Yes Monica Becton, MD  traMADol (ULTRAM) 50 MG  tablet TAKE 1 TABLET BY MOUTH THREE TIMES A DAY AS NEEDED 10/08/22  Yes Monica Becton, MD  AMBULATORY NON FORMULARY MEDICATION Single glucometer with lancets, test strips.  Test 3 times a day 06/16/19   Monica Becton, MD  diphenhydrAMINE (BENADRYL ALLERGY) 25 MG tablet Take 1 tablet (25 mg total) by mouth at bedtime as needed. Patient not taking: Reported on 10/26/2022 10/02/22   Monica Becton, MD  glucose blood (ACCU-CHEK GUIDE) test strip Use as instructed to check blood sugar twice daily. Dx E11.9 09/08/19   Monica Becton, MD     All other systems have been reviewed and were otherwise negative with the exception of those mentioned in the HPI and as above.  Physical Exam: There were  no vitals filed for this visit.  There is no height or weight on file to calculate BMI.  General: Alert, no acute distress Cardiovascular: No pedal edema Respiratory: No cyanosis, no use of accessory musculature Skin: No lesions in the area of chief complaint Neurologic: Sensation intact distally Psychiatric: Patient is competent for consent with normal mood and affect Lymphatic: No axillary or cervical lymphadenopathy  Assessment/Plan: Bilateral buttock and leg pain, with an MRI notable for moderate stenosis at L3-L4, the level of his previous decompression Plan for Procedure(s): LEFT-SIDED LUMBAR 3- LUMBAR 4 LATERAL INTERBODY FUSION WITH INSTRUMENTATION AND ALLOGRAFT, POSTERIOR SPINAL FUSION LUMBAR 3 - LUMBAR 4 WITH INSTRUMENTATION AND ALLOGRAFT   Jackelyn Hoehn, MD 11/07/2022 8:36 AM

## 2022-11-07 NOTE — Progress Notes (Signed)
Placed patient on CPAP for the night with pressure set at 11cm. Patient brought his nasal pillows from home.

## 2022-11-07 NOTE — OR Nursing (Signed)
Per protocol Dr. Yevette Edwards confirmed on 11/07/2022 at 1622 there was no instrumentation or foreign body retained.

## 2022-11-07 NOTE — Anesthesia Procedure Notes (Signed)
Procedure Name: Intubation Date/Time: 11/07/2022 1:49 PM  Performed by: Shary Decamp, CRNAPre-anesthesia Checklist: Patient identified, Patient being monitored, Timeout performed, Emergency Drugs available and Suction available Patient Re-evaluated:Patient Re-evaluated prior to induction Oxygen Delivery Method: Circle System Utilized Preoxygenation: Pre-oxygenation with 100% oxygen Induction Type: IV induction and Rapid sequence Laryngoscope Size: Miller and 3 Grade View: Grade I Tube type: Oral Tube size: 7.5 mm Number of attempts: 1 Airway Equipment and Method: Stylet Placement Confirmation: ETT inserted through vocal cords under direct vision, positive ETCO2 and breath sounds checked- equal and bilateral Secured at: 22 cm Tube secured with: Tape Dental Injury: Teeth and Oropharynx as per pre-operative assessment

## 2022-11-07 NOTE — Transfer of Care (Signed)
Immediate Anesthesia Transfer of Care Note  Patient: Bobby Saunders  Procedure(s) Performed: LEFT-SIDED LUMBAR THREE- LUMBAR FOUR LATERAL INTERBODY FUSION WITH INSTRUMENTATION AND ALLOGRAFT. (Left: Back) POSTERIOR SPINAL FUSION LUMBAR THREE - LUMBAR FOUR WITH INSTRUMENTATION AND ALLOGRAFT (Back)  Patient Location: PACU  Anesthesia Type:General  Level of Consciousness: awake and drowsy  Airway & Oxygen Therapy: Patient Spontanous Breathing  Post-op Assessment: Report given to RN and Post -op Vital signs reviewed and stable  Post vital signs: Reviewed and stable  Last Vitals:  Vitals Value Taken Time  BP 143/91 11/07/22 1652  Temp    Pulse 100 11/07/22 1653  Resp 16 11/07/22 1653  SpO2 96 % 11/07/22 1653  Vitals shown include unvalidated device data.  Last Pain:  Vitals:   11/07/22 1104  TempSrc:   PainSc: 0-No pain         Complications: No notable events documented.

## 2022-11-07 NOTE — Op Note (Signed)
PATIENT NAME: Bobby Saunders   MEDICAL RECORD NO.:   [id number]    DATE OF BIRTH: May 23, 1966   DATE OF PROCEDURE: 11/07/2022                               OPERATIVE REPORT   PREOPERATIVE DIAGNOSES: 1.  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  S/p previous L3/4 decompression in 2015 3.  Bilateral lumbar radiculopathy (M54.16)   POSTOPERATIVE DIAGNOSES: 1.  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  S/p previous L3/4 decompression in 2015 3.  Bilateral lumbar radiculopathy (M54.16)   PROCEDURE:  1.  Left-sided lateral interbody fusion, L3/4  via direct lateral retroperitoneal approach. 2.  Insertion of interbody device x1 (12 x 18 mm x 55 mm Alphatec intervertebral spacer). 3.  Placement of anterior instrumentation, L3/4 4.  Use of morselized allograft -- ViviGen.   5.  Intraoperative use of fluoroscopy. 6.  Posterior spinal fusion, L3-4 7.  Placement of posterior instrumentation, L3, L4   SURGEON:  Estill Bamberg, MD   ASSISTANT:  [name] PA-C.   ANESTHESIA:  General endotracheal anesthesia.   COMPLICATIONS:  None.   DISPOSITION:  Stable.   ESTIMATED BLOOD LOSS:  Minimal.   INDICATIONS:  Briefly, [name] is a very pleasant 56 year old male who is status post a previous decompression at L3-4, in 2015.  He did very well from that surgery, but did develop recurrent bilateral leg pain, primarily with standing and walking.  Recent MRI did reveal recurrent stenosis at his previously decompressed level, at L3-4.  Given his ongoing pain, despite appropriate conservative treatment measures, we did discuss proceeding with the procedure noted above.  He was made fully aware of the risks and limitations of surgery, and did wish to proceed.   DESCRIPTION OF PROCEDURE:  On 11/07/2022, the patient was brought to surgery and general endotracheal anesthesia was administered.  The patient was placed in the lateral decubitus position, with the left side up. Neurologic monitoring leads were placed by  the monitoring technician.  The patient's torso and lower extremities were secured to the bed.  The patient's hips and knees were flexed in order to lessen the tension on the psoas musculature.  The left flank was then prepped and draped in the usual sterile fashion.  The bed was flexed, in order to optimize exposure to the L3/4 intervertebral space.  After a timeout procedure was performed, a left-sided transverse incision was made over the left flank overlying the L3/4 intervertebral space.  The retroperitoneal space was encountered, after dissection through the oblique musculature.  The peritoneum was bluntly swept anteriorly, and the psoas was readily identified.  I did use a series of dilators to dock over the L3/4 intervertebral space.  I did use neurologic monitoring while placing the  dilators, in order to ensure that there were no neurologic structures in the immediate vicinity of the dilators.  The lumbar plexus was noted to be posterior.  A self-retaining retractor was placed, and was attached to a rigid arm. The retractor was very gently dilated and a shim was placed into the L3/4 intervertebral space.  I then used a knife to perform an annulotomy at the lateral aspect of the L3/4 intervertebral space.  I then used a series of curettes and pituitary rongeurs in order to perform a thorough and complete L3/4 intervertebral diskectomy.  The contralateral annulus was released.  I then placed a series of intervertebral spacer trials, and  I did feel that a 12 x 22 mm x 60 mm spacer would be the most appropriate fit.  The appropriate spacer was then packed with ViviGen and tamped into position.  I was very pleased with the final resting position of the intervertebral spacer. Excellent height restoration was noted. At this point, a 14 mm plate was placed over the lateral aspect of the L3 and L4 vertebral bodies.  I then used an awl to prepare the trajectory of the L3 and L4 vertebral body screws. A 50 mm screw  was placed into the L3 vertebral body, and a 50 mm screw was placed into the L4 vertebral body.  The screws were then locked into the plate.  The break in the bed was removed and the bed was flattened and the plate was then locked.  I was very pleased with the final AP and lateral fluoroscopic images and the excellent restoration of disk height identified on both AP and lateral images.  At this point, the wound was copiously irrigated.  The fascia, internal, and external oblique musculature was closed using #1 Vicryl.  The subcutaneous layer was closed using 2-0 Vicryl and the skin was closed using 4-0 Monocryl.    At this point, I made a left-sided paramedian incision overlying the L3 and L4 pedicles and overlying the L3-4 facet joint.  The facet joint was exposed and decorticated, as was the posterior lateral gutter.  The remainder of the allograft was placed along the facet joint and gutter to help aid in the success of the fusion.  At this point, Jamshidi's were advanced across the L3 and L4 pedicles.  Guidewires were placed, and I did use a 6 mm tap over the guidewires, and through the L3 and L4 pedicles.  Triggered EMG was used to test the taps to ensure that they were not in the vicinity of any neurologic structures.   At this point, pedicle screws were placed, 7 x 45 at L3 and L4.  A 50 mm rod was secured into the tulip heads of the screws.  Caps were placed in a final locking procedure was performed.  The wound was copiously irrigated.  I was very pleased with the AP and lateral fluoroscopic images.  The wound was then closed using #1 Vicryl followed by 2-0 Vicryl followed by 4-0 Monocryl.  Benzoin and Steri-Strips were applied over the left lateral wound and left posterior wound, followed by sterile dressing.       Of note, I did use neurologic monitoring throughout the entire surgery, and there was no abnormal or sustained EMG activity noted throughout the entire surgery. All instrument counts were  correct at the termination of the procedure.   Of note, [name] was my assistant throughout surgery, and did aid in retraction, placement of the hardware, suctioning, and closure.  Estill Bamberg, MD

## 2022-11-08 LAB — GLUCOSE, CAPILLARY: Glucose-Capillary: 144 mg/dL — ABNORMAL HIGH (ref 70–99)

## 2022-11-08 MED ORDER — METHOCARBAMOL 500 MG PO TABS: 500.0000 mg | ORAL_TABLET | Freq: Four times a day (QID) | ORAL | 2 refills | Status: DC | PRN

## 2022-11-08 MED ORDER — OXYCODONE-ACETAMINOPHEN 5-325 MG PO TABS: 1.0000 | ORAL_TABLET | ORAL | 0 refills | Status: DC | PRN

## 2022-11-08 NOTE — Plan of Care (Signed)
Patient alert and oriented void, VSS, surgical site clean and dry, no sign of infection. D/c instructions explain and copy given to the patient all questions answered. Will d/c patient per order.

## 2022-11-08 NOTE — Progress Notes (Signed)
    Patient doing well  Has been ambulating Patient reports resolution of his bilateral preoperative leg pain   Physical Exam: Vitals:   11/07/22 2323 11/08/22 0355  BP:  130/85  Pulse: 85 64  Resp: 18 20  Temp:  98.5 F (36.9 C)  SpO2: 96% 96%    Dressing in place NVI  POD #1 s/p L3-4 lateral/posterior fusion, doing very well, with resolved leg pain  - up with PT/OT, encourage ambulation - Percocet for pain, Robaxin for muscle spasms - d/c home today with f/u in 2 weeks

## 2022-11-08 NOTE — Evaluation (Signed)
Occupational Therapy Evaluation Patient Details Name: Bobby Saunders MRN: [id number] DOB: 1966-02-12 Today's Date: 11/08/2022   History of Present Illness 56 yo M s/p L3-4 lateral/posterior fusion.  PMH includes: DM   Clinical Impression   Patient admitted for the diagnosis and procedure above.  PTA he remained active, and needed no assist with any aspect of ADL, iADL or mobility.  Patient is experiencing post op discomfort and left lower extremity weakness, MD aware, but should progress back to baseline very quickly.  All precautions reviewed, ADL completed, all questions answered, and no further OT in the acute setting.  Recommend follow up as prescribed by MD.        Recommendations for follow up therapy are one component of a multi-disciplinary discharge planning process, led by the attending physician.  Recommendations may be updated based on patient status, additional functional criteria and insurance authorization.   Follow Up Recommendations  No OT follow up     Assistance Recommended at Discharge PRN  Patient can return home with the following Assist for transportation    Functional Status Assessment  Patient has not had a recent decline in their functional status  Equipment Recommendations  None recommended by OT    Recommendations for Other Services       Precautions / Restrictions Precautions Precautions: Back Precaution Booklet Issued: Yes (comment) Required Braces or Orthoses: Spinal Brace Spinal Brace: Thoracolumbosacral orthotic Restrictions Weight Bearing Restrictions: No      Mobility Bed Mobility Overal bed mobility: Modified Independent                  Transfers Overall transfer level: Modified independent                        Balance Overall balance assessment: Mild deficits observed, not formally tested                                         ADL either performed or assessed with clinical judgement    ADL Overall ADL's : At baseline                                       General ADL Comments: Alittle assist with threading LLE due to weakness, but should improve quickly.     Vision Patient Visual Report: No change from baseline       Perception     Praxis      Pertinent Vitals/Pain Pain Assessment Pain Assessment: Faces Faces Pain Scale: Hurts little more Pain Location: L leg and low back Pain Descriptors / Indicators: Tender Pain Intervention(s): Monitored during session     Hand Dominance Right   Extremity/Trunk Assessment Upper Extremity Assessment Upper Extremity Assessment: Overall WFL for tasks assessed   Lower Extremity Assessment Lower Extremity Assessment: Defer to PT evaluation   Cervical / Trunk Assessment Cervical / Trunk Assessment: Back Surgery   Communication Communication Communication: No difficulties   Cognition Arousal/Alertness: Awake/alert Behavior During Therapy: WFL for tasks assessed/performed Overall Cognitive Status: Within Functional Limits for tasks assessed                                       General Comments  VSS on RA    Exercises     Shoulder Instructions      Home Living Family/patient expects to be discharged to:: Private residence Living Arrangements: Spouse/significant other Available Help at Discharge: Family;Available 24 hours/day Type of Home: House Home Access: Stairs to enter [employer] of Steps: 2 Entrance Stairs-Rails: Left Home Layout: One level     Bathroom Shower/Tub: Chief Strategy Officer: Standard Bathroom Accessibility: Yes How Accessible: Accessible via walker Home Equipment: None          Prior Functioning/Environment Prior Level of Function : Independent/Modified Independent;Driving                        OT Problem List: Decreased strength;Impaired balance (sitting and/or standing)      OT  Treatment/Interventions:      OT Goals(Current goals can be found in the care plan section) Acute Rehab OT Goals Patient Stated Goal: Return home OT Goal Formulation: With patient Time For Goal Achievement: 11/12/22 Potential to Achieve Goals: Good  OT Frequency:      Co-evaluation              AM-PAC OT "6 Clicks" Daily Activity     Outcome Measure Help from another person eating meals?: None Help from another person taking care of personal grooming?: None Help from another person toileting, which includes using toliet, bedpan, or urinal?: None Help from another person bathing (including washing, rinsing, drying)?: A Little Help from another person to put on and taking off regular upper body clothing?: None Help from another person to put on and taking off regular lower body clothing?: A Little 6 Click Score: 22   End of Session Equipment Utilized During Treatment: Back brace Nurse Communication: Mobility status  Activity Tolerance: Patient tolerated treatment well Patient left: in bed;with call bell/phone within reach;with family/visitor present  OT Visit Diagnosis: Pain Pain - part of body:  (back and L leg)                Time: 8756-4332 OT Time Calculation (min): 20 min Charges:  OT General Charges $OT Visit: 1 Visit OT Evaluation $OT Eval Moderate Complexity: 1 Mod  11/08/2022  RP, OTR/L  Acute Rehabilitation Services  Office:  [phone]   Suzanna Obey 11/08/2022, 8:37 AM

## 2022-11-08 NOTE — Evaluation (Signed)
Physical Therapy Evaluation  Patient Details Name: Bobby Saunders MRN: [id number] DOB: 1966-05-13 Today's Date: 11/08/2022  History of Present Illness  Pt is a 56 y/o M s/p L3-4 lateral/posterior fusion.  PMH includes: DM  Clinical Impression  Patient evaluated by Physical Therapy with no further acute PT needs identified. All education has been completed and the patient has no further questions. Pt was able to demonstrate transfers and ambulation with gross modified independence to supervision for safety and no AD. Pt was educated on precautions, brace application/wearing schedule, appropriate activity progression, and car transfer. See below for any follow-up Physical Therapy or equipment needs. PT is signing off. Thank you for this referral.        Recommendations for follow up therapy are one component of a multi-disciplinary discharge planning process, led by the attending physician.  Recommendations may be updated based on patient status, additional functional criteria and insurance authorization.  Follow Up Recommendations No PT follow up      Assistance Recommended at Discharge PRN  Patient can return home with the following  A little help with walking and/or transfers;A little help with bathing/dressing/bathroom;Assistance with cooking/housework;Assist for transportation;Help with stairs or ramp for entrance    Equipment Recommendations None recommended by PT  Recommendations for Other Services       Functional Status Assessment Patient has had a recent decline in their functional status and demonstrates the ability to make significant improvements in function in a reasonable and predictable amount of time.     Precautions / Restrictions Precautions Precautions: Back Precaution Booklet Issued: Yes (comment) Required Braces or Orthoses: Spinal Brace Spinal Brace: Thoracolumbosacral orthotic Restrictions Weight Bearing Restrictions: No      Mobility  Bed Mobility                General bed mobility comments: Pt was received sitting EOB. Verbally reviewed log roll technique.    Transfers Overall transfer level: Modified independent Equipment used: None               General transfer comment: VC's for wide BOS when transferring to/from a lower surface. Increased time required but pt was able to power up to full stand without assistance.    Ambulation/Gait Ambulation/Gait assistance: Supervision Gait Distance (Feet): 300 Feet Assistive device: None Gait Pattern/deviations: Step-through pattern, Decreased stride length, Trunk flexed Gait velocity: Decreased Gait velocity interpretation: <1.31 ft/sec, indicative of household ambulator   General Gait Details: Slow and guarded due to LLE/groin pain. No overt LOB noted.  Stairs Stairs: Yes Stairs assistance: Min guard Stair Management: One rail Right, Step to pattern, Forwards Number of Stairs: 3 General stair comments: VC's for sequencing and general safety. No assist required.  Wheelchair Mobility    Modified Rankin (Stroke Patients Only)       Balance Overall balance assessment: Mild deficits observed, not formally tested                                           Pertinent Vitals/Pain Pain Assessment Pain Assessment: Faces Faces Pain Scale: Hurts little more Pain Location: L leg/groin and low back Pain Descriptors / Indicators: Tender Pain Intervention(s): Limited activity within patient's tolerance, Monitored during session, Repositioned    Home Living Family/patient expects to be discharged to:: Private residence Living Arrangements: Spouse/significant other Available Help at Discharge: Family;Available 24 hours/day Type of Home: House Home Access: Stairs  to enter Entrance Stairs-Rails: Left Entrance Stairs-Number of Steps: 2   Home Layout: One level Home Equipment: None      Prior Function Prior Level of Function : Independent/Modified  Independent;Driving                     Hand Dominance   Dominant Hand: Right    Extremity/Trunk Assessment   Upper Extremity Assessment Upper Extremity Assessment: Defer to OT evaluation    Lower Extremity Assessment Lower Extremity Assessment: LLE deficits/detail LLE Deficits / Details: Acute pain, decreased strength and AROM consistent with pre-op diagnosis and subsequent surgery.    Cervical / Trunk Assessment Cervical / Trunk Assessment: Back Surgery  Communication   Communication: No difficulties  Cognition Arousal/Alertness: Awake/alert Behavior During Therapy: WFL for tasks assessed/performed Overall Cognitive Status: Within Functional Limits for tasks assessed                                          General Comments      Exercises     Assessment/Plan    PT Assessment Patient does not need any further PT services  PT Problem List         PT Treatment Interventions      PT Goals (Current goals can be found in the Care Plan section)  Acute Rehab PT Goals Patient Stated Goal: Home today PT Goal Formulation: All assessment and education complete, DC therapy    Frequency       Co-evaluation               AM-PAC PT "6 Clicks" Mobility  Outcome Measure Help needed turning from your back to your side while in a flat bed without using bedrails?: None Help needed moving from lying on your back to sitting on the side of a flat bed without using bedrails?: A Little Help needed moving to and from a bed to a chair (including a wheelchair)?: None Help needed standing up from a chair using your arms (e.g., wheelchair or bedside chair)?: None Help needed to walk in hospital room?: A Little Help needed climbing 3-5 steps with a railing? : A Little 6 Click Score: 21    End of Session Equipment Utilized During Treatment: Gait belt;Back brace Activity Tolerance: Patient tolerated treatment well Patient left:  (Sitting EOB awaiting  d/c paperwork) Nurse Communication: Mobility status PT Visit Diagnosis: Unsteadiness on feet (R26.81);Pain Pain - Right/Left: Left Pain - part of body: Hip    Time: 0925-0938 PT Time Calculation (min) (ACUTE ONLY): 13 min   Charges:   PT Evaluation $PT Eval Low Complexity: 1 Low          Bobby Saunders, PT, DPT Acute Rehabilitation Services Secure Chat Preferred Office: [phone]   Bobby Saunders 11/08/2022, 11:55 AM

## 2022-11-08 NOTE — Plan of Care (Signed)
  Problem: Education: Goal: Ability to verbalize activity precautions or restrictions will improve Outcome: Completed/Met Goal: Knowledge of the prescribed therapeutic regimen will improve Outcome: Completed/Met Goal: Understanding of discharge needs will improve Outcome: Completed/Met   

## 2022-11-09 ENCOUNTER — Encounter (HOSPITAL_COMMUNITY): Payer: Self-pay | Admitting: Orthopedic Surgery

## 2022-11-12 ENCOUNTER — Telehealth: Payer: Self-pay | Admitting: General Practice

## 2022-11-12 NOTE — Telephone Encounter (Signed)
Transition Care Management Follow-up Telephone Call Date of discharge and from where: 11/08/22 from [facility] How have you been since you were released from the hospital? Patient is doing better. Any questions or concerns? No  Items Reviewed: Did the pt receive and understand the discharge instructions provided? Yes  Medications obtained and verified? No  Other? No  Any new allergies since your discharge? No  Dietary orders reviewed? Yes Do you have support at home? Yes   Home Care and Equipment/Supplies: Were home health services ordered? no  Functional Questionnaire: (I = Independent and D = Dependent) ADLs: I  Bathing/Dressing- I  Meal Prep- I  Eating- I  Maintaining continence- I  Transferring/Ambulation- I  Managing Meds- I  Follow up appointments reviewed:  PCP Hospital f/u appt confirmed? No   Specialist Hospital f/u appt confirmed? Yes  Scheduled to see the orthopedic surgeon. Are transportation arrangements needed? No If their condition worsens, is the pt aware to call PCP or go to the Emergency Dept.? Yes Was the patient provided with contact information for the PCP's office or ED? Yes Was to pt encouraged to call back with questions or concerns? Yes

## 2022-11-28 NOTE — Discharge Summary (Signed)
Patient ID: Bobby Saunders MRN: [id number] DOB/AGE: 56-Mar-1967 56 y.o.  Admit date: 11/07/2022 Discharge date: 11/08/2022  Admission Diagnoses:  Principal Problem:   Spinal stenosis   Discharge Diagnoses:  Same  Past Medical History:  Diagnosis Date   Diabetes mellitus without complication (HCC)    type II   Sleep apnea    cpap 6 yrs   Vitamin D insufficiency 01/07/2018    Surgeries: Procedure(s): LEFT-SIDED LUMBAR THREE- LUMBAR FOUR LATERAL INTERBODY FUSION WITH INSTRUMENTATION AND ALLOGRAFT. POSTERIOR SPINAL FUSION LUMBAR THREE - LUMBAR FOUR WITH INSTRUMENTATION AND ALLOGRAFT on 11/07/2022   Consultants: None  Discharged Condition: Improved  Hospital Course: Bobby Saunders is an 56 y.o. male who was admitted 11/07/2022 for operative treatment of Spinal stenosis. Patient has severe unremitting pain that affects sleep, daily activities, and work/hobbies. After pre-op clearance the patient was taken to the operating room on 11/07/2022 and underwent  Procedure(s): LEFT-SIDED LUMBAR THREE- LUMBAR FOUR LATERAL INTERBODY FUSION WITH INSTRUMENTATION AND ALLOGRAFT. POSTERIOR SPINAL FUSION LUMBAR THREE - LUMBAR FOUR WITH INSTRUMENTATION AND ALLOGRAFT.    Patient was given perioperative antibiotics:  Anti-infectives (From admission, onward)    Start     Dose/Rate Route Frequency Ordered Stop   11/07/22 2000  ceFAZolin (ANCEF) IVPB 2g/100 mL premix        2 g 200 mL/hr over 30 Minutes Intravenous Every 8 hours 11/07/22 1855 11/08/22 0404   11/07/22 1300  ceFAZolin (ANCEF) IVPB 3g/100 mL premix  Status:  Discontinued        3 g 200 mL/hr over 30 Minutes Intravenous On call to O.R. 11/07/22 1043 11/07/22 1654        Patient was given sequential compression devices, early ambulation to prevent DVT.  Patient benefited maximally from hospital stay and there were no complications.    Recent vital signs: BP 128/73 (BP Location: Left Arm)   Pulse 83   Temp 98 F (36.7 C)  (Oral)   Resp 18   Ht 5\' 10"  (1.778 m)   Wt 122.5 kg   SpO2 96%   BMI 38.74 kg/m    Discharge Medications:   Allergies as of 11/08/2022   No Known Allergies      Medication List     STOP taking these medications    traMADol 50 MG tablet Commonly known as: ULTRAM       TAKE these medications    Accu-Chek Guide test strip Generic drug: glucose blood Use as instructed to check blood sugar twice daily. Dx E11.9   AMBULATORY NON FORMULARY MEDICATION Single glucometer with lancets, test strips.  Test 3 times a day   atorvastatin 40 MG tablet Commonly known as: LIPITOR TAKE 1 TABLET BY MOUTH EVERY DAY   diphenhydrAMINE 25 MG tablet Commonly known as: Benadryl Allergy Take 1 tablet (25 mg total) by mouth at bedtime as needed.   metFORMIN 1000 MG tablet Commonly known as: GLUCOPHAGE TAKE 1 TABLET BY MOUTH TWICE A DAY WITH FOOD   methocarbamol 500 MG tablet Commonly known as: ROBAXIN Take 1-2 tablets (500-1,000 mg total) by mouth every 6 (six) hours as needed for muscle spasms.   oxyCODONE-acetaminophen 5-325 MG tablet Commonly known as: PERCOCET/ROXICET Take 1-2 tablets by mouth every 4 (four) hours as needed for severe pain.   pantoprazole 40 MG tablet Commonly known as: PROTONIX TAKE 1 TABLET BY MOUTH EVERY DAY   pregabalin 75 MG capsule Commonly known as: LYRICA TAKE 1 CAPSULE 3 TIMES A DAY AS NEEDED  Semaglutide (2 MG/DOSE) 8 MG/3ML Sopn Inject 2 mg as directed once a week. What changed: when to take this        Diagnostic Studies: DG Lumbar Spine 2-3 Views  Result Date: 11/07/2022 CLINICAL DATA:  L3-4 fusion EXAM: LUMBAR SPINE - 2-3 VIEW COMPARISON:  07/21/2021 FINDINGS: Two fluoroscopic images are obtained during the performance of the procedure and are provided for interpretation only. The images demonstrate left-sided posterior fusion hardware with disc spacer, likely at the L3-4 level. Evaluation is limited due to small field of view. Please  refer to operative report. Fluoroscopy time: 3 minute 41 seconds, 184.96 mGy IMPRESSION: 1. Intraoperative examination during lumbar fusion and discectomy. Please refer to operative report. Electronically Signed   By: Sharlet Salina M.D.   On: 11/07/2022 16:37   DG C-Arm 1-60 Min-No Report  Result Date: 11/07/2022 Fluoroscopy was utilized by the requesting physician.  No radiographic interpretation.   DG C-Arm 1-60 Min-No Report  Result Date: 11/07/2022 Fluoroscopy was utilized by the requesting physician.  No radiographic interpretation.   DG C-Arm 1-60 Min-No Report  Result Date: 11/07/2022 Fluoroscopy was utilized by the requesting physician.  No radiographic interpretation.    Disposition: Discharge disposition: 01-Home or Self Care        POD #1 s/p L3-4 lateral/posterior fusion, doing very well, with resolved leg pain   - up with PT/OT, encourage ambulation - Percocet for pain, Robaxin for muscle spasms -Scripts for pain sent to pharmacy electronically  -D/C instructions sheet printed and in chart -D/C today  -F/U in office 2 weeks   Signed: Eilene Ghazi Dequon Schnebly 11/28/2022, 11:52 AM

## 2022-12-02 ENCOUNTER — Other Ambulatory Visit: Payer: Self-pay | Admitting: Sports Medicine

## 2023-01-27 ENCOUNTER — Other Ambulatory Visit: Payer: Self-pay | Admitting: Sports Medicine

## 2023-01-27 DIAGNOSIS — M5412 Radiculopathy, cervical region: Secondary | ICD-10-CM

## 2023-03-10 ENCOUNTER — Other Ambulatory Visit: Payer: Self-pay | Admitting: Sports Medicine

## 2023-03-10 DIAGNOSIS — E119 Type 2 diabetes mellitus without complications: Secondary | ICD-10-CM

## 2023-07-09 ENCOUNTER — Ambulatory Visit: Payer: BC Managed Care – PPO | Admitting: Sports Medicine

## 2023-08-22 ENCOUNTER — Encounter: Payer: Self-pay | Admitting: Sports Medicine

## 2023-08-22 ENCOUNTER — Other Ambulatory Visit (INDEPENDENT_AMBULATORY_CARE_PROVIDER_SITE_OTHER): Payer: BC Managed Care – PPO

## 2023-08-22 ENCOUNTER — Ambulatory Visit: Payer: BC Managed Care – PPO | Admitting: Sports Medicine

## 2023-08-22 ENCOUNTER — Ambulatory Visit: Payer: BC Managed Care – PPO

## 2023-08-22 DIAGNOSIS — M79645 Pain in left finger(s): Secondary | ICD-10-CM

## 2023-08-22 NOTE — Assessment & Plan Note (Signed)
Pleasant 57 year old male, 2 months pain left second DIP, severe, worse with flexion, on exam tenderness to palpation, visible swelling. Activity modification and oral NSAIDs not effective, we injected the left second DIP today, I would like x-rays, return to see me in 4 to 6 weeks, MRI if not better.

## 2023-08-22 NOTE — Progress Notes (Signed)
    Procedures performed today:    Procedure: Real-time Ultrasound Guided injection of the Left second DIP Device: Samsung [id number]  Verbal informed consent obtained.  Time-out conducted.  Noted no overlying erythema, induration, or other signs of local infection.  Skin prepped in a sterile fashion.  Local anesthesia: Topical Ethyl chloride.  With sterile technique and under real time ultrasound guidance: Arthritic joint noted, 1/2 cc lidocaine, 1/2 cc kenalog 40 injected easily  completed without difficulty  Advised to call if fevers/chills, erythema, induration, drainage, or persistent bleeding.  Images permanently stored and available for review in PACS.  Impression: Technically successful ultrasound guided injection.  Independent interpretation of notes and tests performed by another provider:   None.  Brief History, Exam, Impression, and Recommendations:    Left second DIP pain Pleasant 57 year old male, 2 months pain left second DIP, severe, worse with flexion, on exam tenderness to palpation, visible swelling. Activity modification and oral NSAIDs not effective, we injected the left second DIP today, I would like x-rays, return to see me in 4 to 6 weeks, MRI if not better.    ____________________________________________ [name]. Benjamin Stain, M.D., ABFM., CAQSM., AME. Primary Care and Sports Medicine [facility]  Adjunct Professor of Family Medicine  [employer]  Restaurant manager, fast food

## 2023-09-11 ENCOUNTER — Other Ambulatory Visit: Payer: Self-pay | Admitting: Sports Medicine

## 2023-09-11 DIAGNOSIS — E119 Type 2 diabetes mellitus without complications: Secondary | ICD-10-CM

## 2023-09-11 DIAGNOSIS — K219 Gastro-esophageal reflux disease without esophagitis: Secondary | ICD-10-CM

## 2023-10-03 ENCOUNTER — Ambulatory Visit: Payer: BC Managed Care – PPO | Admitting: Sports Medicine

## 2023-10-25 ENCOUNTER — Other Ambulatory Visit: Payer: Self-pay | Admitting: Sports Medicine

## 2024-01-01 ENCOUNTER — Other Ambulatory Visit: Payer: Self-pay

## 2024-01-01 ENCOUNTER — Ambulatory Visit
Admission: RE | Admit: 2024-01-01 | Discharge: 2024-01-01 | Disposition: A | Discharge status: Home / Self Care | Payer: BC Managed Care – PPO | Source: Ambulatory Visit | Attending: Family Medicine | Admitting: Family Medicine

## 2024-01-01 ENCOUNTER — Ambulatory Visit: Payer: BC Managed Care – PPO

## 2024-01-01 VITALS — BP 146/82 | HR 61 | Temp 98.5°F | Resp 16

## 2024-01-01 DIAGNOSIS — M25512 Pain in left shoulder: Secondary | ICD-10-CM

## 2024-01-01 DIAGNOSIS — S40012A Contusion of left shoulder, initial encounter: Secondary | ICD-10-CM | POA: Diagnosis not present

## 2024-01-01 DIAGNOSIS — S4992XA Unspecified injury of left shoulder and upper arm, initial encounter: Secondary | ICD-10-CM

## 2024-01-01 MED ORDER — HYDROCODONE-ACETAMINOPHEN 5-325 MG PO TABS: 1.0000 | ORAL_TABLET | Freq: Four times a day (QID) | ORAL | 0 refills | Status: DC | PRN

## 2024-01-01 MED ORDER — CELECOXIB 200 MG PO CAPS: 200.0000 mg | ORAL_CAPSULE | Freq: Every day | ORAL | 0 refills | Status: AC

## 2024-01-01 MED ORDER — PREDNISONE 10 MG (21) PO TBPK: ORAL_TABLET | Freq: Every day | ORAL | 0 refills | Status: DC

## 2024-01-01 NOTE — Discharge Instructions (Addendum)
Advised patient of x-ray results with hardcopy and images provided.  Advised patient to take medications as directed with food to completion.  Advised may take Norco daily, as needed for severe left shoulder pain.  Patient advised of sedative effects of this medication.  Advised patient to RICE affected area of left shoulder for 30 minutes 3 times daily for 3 days.  Advised patient to wear left shoulder sling 24/7 until evaluated by [facility].  Contact information provided with this AVS today.

## 2024-01-01 NOTE — ED Triage Notes (Signed)
Using wench, chain broke and hook hit him in left shoulder. Has abrasions to shoulder and swelling. This happened yesterday. Took ibuprofen yesterday.

## 2024-01-01 NOTE — ED Provider Notes (Signed)
[facility]    CSN: [id number] Arrival date & time: 01/01/24  0800      History   Chief Complaint Chief Complaint  Patient presents with   Shoulder Injury    HPI Bobby Saunders is a 58 y.o. male.   HPI 58 year old male presents with left shoulder injury sustained at work yesterday.  Patient reports roughly 8 AM yesterday morning he was using a chain wrench to lift object when chain broke and chain wrench suddenly struck his left shoulder. PMH significant for morbid obesity, T2DM without complication and sleep apnea.  Past Medical History:  Diagnosis Date   Diabetes mellitus without complication (HCC)    type II   Sleep apnea    cpap 6 yrs   Vitamin D insufficiency 01/07/2018    Patient Active Problem List   Diagnosis Date Noted   Left second DIP pain 08/22/2023   Spinal stenosis 11/07/2022   Acute cholecystitis 01/02/2022   Cholecystitis, acute 01/01/2022   Transaminitis 11/17/2021   GERD (gastroesophageal reflux disease) 08/22/2021   Radiculitis of left cervical region 09/09/2020   Left wrist ECU subluxation and TFCC tear 11/05/2019   Skin lesion of scalp 08/27/2019   Right foot pain 08/18/2018   Skin rash 08/18/2018   Vitamin D insufficiency 01/07/2018   Insomnia 10/11/2017   Controlled type 2 diabetes mellitus without complication, without long-term current use of insulin (HCC) 09/15/2017   Left knee injury 09/13/2017   Primary osteoarthritis of left elbow 02/02/2016   Right shoulder pain 03/21/2015   Obstructive apnea 07/09/2014   Hyperlipidemia with target LDL less than 100 12/18/2012   Hypogonadism male 12/18/2012   Annual physical exam 11/03/2012   Low back pain with spinal stenosis 06/02/2012    Past Surgical History:  Procedure Laterality Date   ANTERIOR LAT LUMBAR FUSION Left 11/07/2022   Procedure: LEFT-SIDED LUMBAR THREE- LUMBAR FOUR LATERAL INTERBODY FUSION WITH INSTRUMENTATION AND ALLOGRAFT.;  Surgeon: Estill Bamberg, MD;   Location: [facility];  Service: Orthopedics;  Laterality: Left;   CHOLECYSTECTOMY N/A 01/03/2022   Procedure: LAPAROSCOPIC CHOLECYSTECTOMY;  Surgeon: Quentin Ore, MD;  Location: [facility];  Service: General;  Laterality: N/A;   ELBOW SURGERY Bilateral 01 last   numerous lft , one right   LUMBAR LAMINECTOMY/DECOMPRESSION MICRODISCECTOMY N/A 05/13/2014   Procedure: LUMBAR LAMINECTOMY/DECOMPRESSION MICRODISCECTOMY;  Surgeon: Emilee Hero, MD;  Location: [facility];  Service: Orthopedics;  Laterality: N/A;  Lumbar 3-4 decompression       Home Medications    Prior to Admission medications   Medication Sig Start Date End Date Taking? Authorizing Provider  celecoxib (CELEBREX) 200 MG capsule Take 1 capsule (200 mg total) by mouth daily for 15 days. 01/01/24 01/16/24 Yes Trevor Iha, FNP  HYDROcodone-acetaminophen (NORCO/VICODIN) 5-325 MG tablet Take 1 tablet by mouth every 6 (six) hours as needed. 01/01/24  Yes Trevor Iha, FNP  predniSONE (STERAPRED UNI-PAK 21 TAB) 10 MG (21) TBPK tablet Take by mouth daily. Take 6 tabs by mouth daily  for 2 days, then 5 tabs for 2 days, then 4 tabs for 2 days, then 3 tabs for 2 days, 2 tabs for 2 days, then 1 tab by mouth daily for 2 days 01/01/24  Yes Trevor Iha, FNP  AMBULATORY NON FORMULARY MEDICATION Single glucometer with lancets, test strips.  Test 3 times a day 06/16/19   Monica Becton, MD  atorvastatin (LIPITOR) 40 MG tablet TAKE 1 TABLET BY MOUTH EVERY DAY 10/25/23   Monica Becton, MD  diphenhydrAMINE (BENADRYL ALLERGY) 25 MG tablet Take 1 tablet (25 mg total) by mouth at bedtime as needed. 10/02/22   Monica Becton, MD  glucose blood (ACCU-CHEK GUIDE) test strip Use as instructed to check blood sugar twice daily. Dx E11.9 09/08/19   Monica Becton, MD  metFORMIN (GLUCOPHAGE) 1000 MG tablet TAKE 1 TABLET BY MOUTH TWICE A DAY WITH FOOD 09/11/23   Monica Becton, MD  methocarbamol (ROBAXIN) 500 MG tablet Take  1-2 tablets (500-1,000 mg total) by mouth every 6 (six) hours as needed for muscle spasms. 11/08/22   Estill Bamberg, MD  oxyCODONE-acetaminophen (PERCOCET/ROXICET) 5-325 MG tablet Take 1-2 tablets by mouth every 4 (four) hours as needed for severe pain. 11/08/22   Estill Bamberg, MD  pantoprazole (PROTONIX) 40 MG tablet TAKE 1 TABLET BY MOUTH EVERY DAY 09/11/23   Monica Becton, MD  pregabalin (LYRICA) 75 MG capsule TAKE 1 CAPSULE BY MOUTH 3 TIMES A DAY AS NEEDED 12/03/22   Monica Becton, MD  Semaglutide, 2 MG/DOSE, 8 MG/3ML SOPN Inject 2 mg as directed once a week. Patient taking differently: Inject 2 mg as directed every Wednesday. 10/10/22   Monica Becton, MD    Family History Family History  Problem Relation Age of Onset   Cancer Sister        colon   Hyperlipidemia Mother    Hypertension Mother    Cancer Maternal Uncle    Cancer Maternal Grandmother    Sudden death Neg Hx    Heart attack Neg Hx    Diabetes Neg Hx     Social History Social History   Tobacco Use   Smoking status: Former    Current packs/day: 0.00    Average packs/day: 1 pack/day for 25.0 years (25.0 ttl pk-yrs)    Types: Cigarettes    Start date: 05/05/1980    Quit date: 05/05/2005    Years since quitting: 18.6   Smokeless tobacco: Former    Quit date: 2021  Vaping Use   Vaping status: Never Used  Substance Use Topics   Alcohol use: No    Alcohol/week: 0.0 standard drinks of alcohol   Drug use: No     Allergies   Patient has no known allergies.   Review of Systems Review of Systems  Musculoskeletal:        Left shoulder injury that occurred yesterday  All other systems reviewed and are negative.    Physical Exam Triage Vital Signs ED Triage Vitals  Encounter Vitals Group     BP 01/01/24 0807 (!) 146/82     Systolic BP Percentile --      Diastolic BP Percentile --      Pulse Rate 01/01/24 0807 61     Resp 01/01/24 0807 16     Temp 01/01/24 0807 98.5 F (36.9 C)      Temp src --      SpO2 01/01/24 0807 99 %     Weight --      Height --      Head Circumference --      Peak Flow --      Pain Score 01/01/24 0811 0     Pain Loc --      Pain Education --      Exclude from Growth Chart --    No data found.  Updated Vital Signs BP (!) 146/82   Pulse 61   Temp 98.5 F (36.9 C)   Resp 16   SpO2  99%       Physical Exam Vitals and nursing note reviewed.  Constitutional:      Appearance: Normal appearance. He is normal weight.  HENT:     Head: Normocephalic and atraumatic.     Mouth/Throat:     Mouth: Mucous membranes are moist.     Pharynx: Oropharynx is clear.  Eyes:     Extraocular Movements: Extraocular movements intact.     Conjunctiva/sclera: Conjunctivae normal.     Pupils: Pupils are equal, round, and reactive to light.  Cardiovascular:     Rate and Rhythm: Normal rate and regular rhythm.     Pulses: Normal pulses.     Heart sounds: Normal heart sounds.  Pulmonary:     Effort: Pulmonary effort is normal.     Breath sounds: Normal breath sounds. No wheezing, rhonchi or rales.  Musculoskeletal:        General: Normal range of motion.     Cervical back: Normal range of motion and neck supple.     Comments: Left shoulder: Significant soft tissue swelling over GH with well-healing skin abrasions noted please see image below, exam limited due to pain this morning  Skin:    General: Skin is warm and dry.  Neurological:     General: No focal deficit present.     Mental Status: He is alert and oriented to person, place, and time.  Psychiatric:        Mood and Affect: Mood normal.        Behavior: Behavior normal.        Thought Content: Thought content normal.      UC Treatments / Results  Labs (all labs ordered are listed, but only abnormal results are displayed) Labs Reviewed - No data to display  EKG   Radiology DG Shoulder Left Result Date: 01/01/2024 CLINICAL DATA:  Left shoulder injury sustained yesterday with soft  tissue swelling EXAM: LEFT SHOULDER - 3 VIEW COMPARISON:  None Available. FINDINGS: There is no evidence of fracture or dislocation. Minor spurring at the [facility] joint. No joint space narrowing or evidence of bone lesion. IMPRESSION: No acute finding. Mild spurring at the [facility] joint. Electronically Signed   By: Tiburcio Pea M.D.   On: 01/01/2024 09:15    Procedures Procedures (including critical care time)  Medications Ordered in UC Medications - No data to display  Initial Impression / Assessment and Plan / UC Course  I have reviewed the triage vital signs and the nursing notes.  Pertinent labs & imaging results that were available during my care of the patient were reviewed by me and considered in my medical decision making (see chart for details).     MDM: 1.  Injury of left shoulder, initial encounter-left shoulder x-ray results revealed above, Rx'd Celebrex 200 mg capsule: Take 1 capsule daily x 15 days Rx'd Sterapred Unipak (tapering from 60 mg to 10 mg over 10 days); 2.  Acute pain of left shoulder-Rx'd Norco/325 mg tablet: Take 1 tablet every 6 hours, as needed for severe pain: 3.  Contusion of left shoulder, initial encounter- Advised patient to RICE affected area of left shoulder for 30 minutes 3 times daily for 3 days.  Advised patient to RICE affected area of left shoulder for 30 minutes 3 times daily for 3 days.  Advised patient to wear left shoulder sling 24/7 until evaluated by [facility].  Contact information provided with this AVS today.   Final diagnoses:  Injury of left shoulder,  initial encounter  Acute pain of left shoulder  Contusion of left shoulder, initial encounter     Discharge Instructions      Advised patient of x-ray results with hardcopy and images provided.  Advised patient to take medications as directed with food to completion.  Advised may take Norco daily, as needed for severe left shoulder pain.  Patient advised of sedative effects of this  medication.  Advised patient to RICE affected area of left shoulder for 30 minutes 3 times daily for 3 days.  Advised patient to wear left shoulder sling 24/7 until evaluated by [facility].  Contact information provided with this AVS today.     ED Prescriptions     Medication Sig Dispense Auth. Provider   celecoxib (CELEBREX) 200 MG capsule Take 1 capsule (200 mg total) by mouth daily for 15 days. 15 capsule Trevor Iha, FNP   predniSONE (STERAPRED UNI-PAK 21 TAB) 10 MG (21) TBPK tablet Take by mouth daily. Take 6 tabs by mouth daily  for 2 days, then 5 tabs for 2 days, then 4 tabs for 2 days, then 3 tabs for 2 days, 2 tabs for 2 days, then 1 tab by mouth daily for 2 days 42 tablet Trevor Iha, FNP   HYDROcodone-acetaminophen (NORCO/VICODIN) 5-325 MG tablet Take 1 tablet by mouth every 6 (six) hours as needed. 24 tablet Trevor Iha, FNP      I have reviewed the PDMP during this encounter.   Trevor Iha, FNP 01/01/24 [phone]

## 2024-07-09 ENCOUNTER — Ambulatory Visit: Admitting: Sports Medicine

## 2024-07-09 VITALS — BP 114/68 | HR 48 | Temp 97.7°F | Resp 20 | Ht 70.0 in | Wt 259.0 lb

## 2024-07-09 DIAGNOSIS — Z0000 Encounter for general adult medical examination without abnormal findings: Secondary | ICD-10-CM

## 2024-07-09 DIAGNOSIS — K529 Noninfective gastroenteritis and colitis, unspecified: Secondary | ICD-10-CM | POA: Diagnosis not present

## 2024-07-09 DIAGNOSIS — Z23 Encounter for immunization: Secondary | ICD-10-CM

## 2024-07-09 DIAGNOSIS — E119 Type 2 diabetes mellitus without complications: Secondary | ICD-10-CM | POA: Diagnosis not present

## 2024-07-09 DIAGNOSIS — Z7984 Long term (current) use of oral hypoglycemic drugs: Secondary | ICD-10-CM

## 2024-07-09 MED ORDER — HYOSCYAMINE SULFATE 0.125 MG PO TABS: 0.1250 mg | ORAL_TABLET | ORAL | 3 refills | Status: AC | PRN

## 2024-07-09 NOTE — Assessment & Plan Note (Signed)
 [name] does endorse chronic diarrhea over the past 6 months.  He stopped his metformin  and it continued. No changes in diet, no melena, hematochezia, hematemesis, it is painless, it occurs typically immediately after eating. Exam is normal. I suggested this was likely related to irritable bowel syndrome, we will add Levsin  and a low FODMAP diet. In addition considering chronicity I would like to get stool studies including ova and parasites, cultures. I would like gastroenterology to weigh in as well. Return to see me in 4 to 6 weeks to reevaluate.

## 2024-07-09 NOTE — Assessment & Plan Note (Signed)
 Annual physical as above, Tdap today, catching up on diabetic screenings. He has been off of all medications for over 6 months.

## 2024-07-09 NOTE — Progress Notes (Signed)
 Subjective:    CC: Annual Physical Exam  HPI:  This patient is here for their annual physical  I reviewed the past medical history, family history, social history, surgical history, and allergies today and no changes were needed.  Please see the problem list section below in epic for further details.  Past Medical History: Past Medical History:  Diagnosis Date   Diabetes mellitus without complication (HCC)    type II   Sleep apnea    cpap 6 yrs   Vitamin D  insufficiency 01/07/2018   Past Surgical History: Past Surgical History:  Procedure Laterality Date   ANTERIOR LAT LUMBAR FUSION Left 11/07/2022   Procedure: LEFT-SIDED LUMBAR THREE- LUMBAR FOUR LATERAL INTERBODY FUSION WITH INSTRUMENTATION AND ALLOGRAFT.;  Surgeon: Beuford Anes, MD;  Location: [facility];  Service: Orthopedics;  Laterality: Left;   CHOLECYSTECTOMY N/A 01/03/2022   Procedure: LAPAROSCOPIC CHOLECYSTECTOMY;  Surgeon: Lyndel Deward PARAS, MD;  Location: [facility];  Service: General;  Laterality: N/A;   ELBOW SURGERY Bilateral 01 last   numerous lft , one right   LUMBAR LAMINECTOMY/DECOMPRESSION MICRODISCECTOMY N/A 05/13/2014   Procedure: LUMBAR LAMINECTOMY/DECOMPRESSION MICRODISCECTOMY;  Surgeon: Anes Rodgers Beuford, MD;  Location: [facility];  Service: Orthopedics;  Laterality: N/A;  Lumbar 3-4 decompression   Social History: Social History   Socioeconomic History   Marital status: Married    Spouse name: Community education officer   Number of children: 2   Years of education: Not on file   Highest education level: Not on file  Occupational History   Not on file  Tobacco Use   Smoking status: Former    Current packs/day: 0.00    Average packs/day: 1 pack/day for 25.0 years (25.0 ttl pk-yrs)    Types: Cigarettes    Start date: 05/05/1980    Quit date: 05/05/2005    Years since quitting: 19.1   Smokeless tobacco: Former    Quit date: 2021  Vaping Use   Vaping status: Never Used  Substance and Sexual Activity   Alcohol use: No     Alcohol/week: 0.0 standard drinks of alcohol   Drug use: No   Sexual activity: Yes  Other Topics Concern   Not on file  Social History Narrative   Not on file   Social Drivers of Health   Financial Resource Strain: Low Risk  (09/02/2021)   Received from [facility]   Overall Financial Resource Strain (CARDIA)    Difficulty of Paying Living Expenses: Not hard at all  Food Insecurity: No Food Insecurity (09/04/2022)   Received from [facility]   Hunger Vital Sign    Within the past 12 months, you worried that your food would run out before you got the money to buy more.: Never true    Within the past 12 months, the food you bought just didn't last and you didn't have money to get more.: Never true  Transportation Needs: No Transportation Needs (09/02/2021)   Received from [facility] - Transportation    Lack of Transportation (Medical): No    Lack of Transportation (Non-Medical): No  Physical Activity: Unknown (09/02/2021)   Received from [facility]   Exercise Vital Sign    On average, how many days per week do you engage in moderate to strenuous exercise (like a brisk walk)?: Patient declined    Minutes of Exercise per Session: Not on file  Stress: No Stress Concern Present (09/02/2021)   Received from [facility] - Occupational  Stress Questionnaire    Feeling of Stress : Not at all  Social Connections: Unknown (04/14/2022)   [facility]   Social Network    Social Network: Not on file   Family History: Family History  Problem Relation Age of Onset   Cancer Sister        colon   Hyperlipidemia Mother    Hypertension Mother    Cancer Maternal Uncle    Cancer Maternal Grandmother    Sudden death Neg Hx    Heart attack Neg Hx    Diabetes Neg Hx    Allergies: No Known Allergies Medications: See med rec.  Review of Systems: No headache, visual changes, nausea, vomiting, diarrhea, constipation,  dizziness, abdominal pain, skin rash, fevers, chills, night sweats, swollen lymph nodes, weight loss, chest pain, body aches, joint swelling, muscle aches, shortness of breath, mood changes, visual or auditory hallucinations.  Objective:    General: Well Developed, well nourished, and in no acute distress.  Neuro: Alert and oriented x3, extra-ocular muscles intact, sensation grossly intact. Cranial nerves II through XII are intact, motor, sensory, and coordinative functions are all intact. HEENT: Normocephalic, atraumatic, pupils equal round reactive to light, neck supple, no masses, no lymphadenopathy, thyroid nonpalpable. Oropharynx, nasopharynx, external ear canals are unremarkable. Skin: Warm and dry, no rashes noted.  Cardiac: Regular rate and rhythm, no murmurs rubs or gallops.  Respiratory: Clear to auscultation bilaterally. Not using accessory muscles, speaking in full sentences.  Abdominal: Soft, nontender, nondistended, positive bowel sounds, no masses, no organomegaly.  Musculoskeletal: Shoulder, elbow, wrist, hip, knee, ankle stable, and with full range of motion.  Impression and Recommendations:    The patient was counselled, risk factors were discussed, anticipatory guidance given.  Annual physical exam Annual physical as above, Tdap today, catching up on diabetic screenings. He has been off of all medications for over 6 months.  Chronic diarrhea [name] does endorse chronic diarrhea over the past 6 months.  He stopped his metformin  and it continued. No changes in diet, no melena, hematochezia, hematemesis, it is painless, it occurs typically immediately after eating. Exam is normal. I suggested this was likely related to irritable bowel syndrome, we will add Levsin  and a low FODMAP diet. In addition considering chronicity I would like to get stool studies including ova and parasites, cultures. I would like gastroenterology to weigh in as well. Return to see me in 4 to 6 weeks  to reevaluate.  ____________________________________________ Debby PARAS. Curtis, M.D., ABFM., CAQSM., AME. Primary Care and Sports Medicine [facility]  Adjunct Professor of [facility] Medicine  [employer]  Restaurant manager, fast food

## 2024-07-10 ENCOUNTER — Ambulatory Visit: Payer: Self-pay | Admitting: Sports Medicine

## 2024-07-10 LAB — LIPID PANEL
Chol/HDL Ratio: 4.2 ratio (ref 0.0–5.0)
Cholesterol, Total: 199 mg/dL (ref 100–199)
HDL: 47 mg/dL (ref >39)
LDL Chol Calc (NIH): 139 mg/dL — ABNORMAL HIGH (ref 0–99)
Triglycerides: 73 mg/dL (ref 0–149)
VLDL Cholesterol Cal: 13 mg/dL (ref 5–40)

## 2024-07-10 LAB — MICROALBUMIN / CREATININE URINE RATIO
Creatinine, Urine: 175.4 mg/dL
Microalb/Creat Ratio: 4 mg/g{creat} (ref 0–29)
Microalbumin, Urine: 6.3 ug/mL

## 2024-07-10 LAB — COMPREHENSIVE METABOLIC PANEL WITH GFR
ALT: 18 IU/L (ref 0–44)
AST: 19 IU/L (ref 0–40)
Albumin: 4.7 g/dL (ref 3.8–4.9)
Alkaline Phosphatase: 63 IU/L (ref 44–121)
BUN/Creatinine Ratio: 15 (ref 9–20)
BUN: 16 mg/dL (ref 6–24)
Bilirubin Total: 0.9 mg/dL (ref 0.0–1.2)
CO2: 21 mmol/L (ref 20–29)
Calcium: 9.5 mg/dL (ref 8.7–10.2)
Chloride: 101 mmol/L (ref 96–106)
Creatinine, Ser: 1.08 mg/dL (ref 0.76–1.27)
Globulin, Total: 2.5 g/dL (ref 1.5–4.5)
Glucose: 92 mg/dL (ref 70–99)
Potassium: 4.6 mmol/L (ref 3.5–5.2)
Sodium: 140 mmol/L (ref 134–144)
Total Protein: 7.2 g/dL (ref 6.0–8.5)
eGFR: 80 mL/min/1.73 (ref >59)

## 2024-07-10 LAB — CBC
Hematocrit: 51 % (ref 37.5–51.0)
Hemoglobin: 16.8 g/dL (ref 13.0–17.7)
MCH: 31.2 pg (ref 26.6–33.0)
MCHC: 32.9 g/dL (ref 31.5–35.7)
MCV: 95 fL (ref 79–97)
Platelets: 190 x10E3/uL (ref 150–450)
RBC: 5.38 x10E6/uL (ref 4.14–5.80)
RDW: 13.2 % (ref 11.6–15.4)
WBC: 7.9 x10E3/uL (ref 3.4–10.8)

## 2024-07-10 LAB — LIPASE: Lipase: 35 U/L (ref 13–78)

## 2024-07-10 LAB — E. HISTOLYTICA ANTIBODY (AMOEBA AB): E histolytica Ab: NEGATIVE

## 2024-07-10 LAB — URIC ACID: Uric Acid: 5.7 mg/dL (ref 3.8–8.4)

## 2024-07-10 LAB — HEMOGLOBIN A1C
Est. average glucose Bld gHb Est-mCnc: 171 mg/dL
Hgb A1c MFr Bld: 7.6 % — ABNORMAL HIGH (ref 4.8–5.6)

## 2024-07-10 LAB — TSH: TSH: 1.19 u[IU]/mL (ref 0.450–4.500)

## 2024-07-14 NOTE — Addendum Note (Signed)
 Addended by: OLEY CHIQUITA CROME on: 07/14/2024 04:58 PM   Modules accepted: Orders

## 2024-07-14 NOTE — Telephone Encounter (Signed)
 Thank you, please reconcile with the patient's med list.

## 2024-07-15 LAB — CLOSTRIDIUM DIFFICILE EIA: C difficile Toxins A+B, EIA: NEGATIVE

## 2024-07-16 LAB — OVA AND PARASITE EXAMINATION

## 2024-07-16 LAB — SPECIMEN STATUS REPORT

## 2024-07-17 ENCOUNTER — Other Ambulatory Visit: Payer: Self-pay | Admitting: Sports Medicine

## 2024-07-17 DIAGNOSIS — K529 Noninfective gastroenteritis and colitis, unspecified: Secondary | ICD-10-CM

## 2024-07-17 LAB — STOOL CULTURE: E coli, Shiga toxin Assay: NEGATIVE

## 2024-07-17 LAB — SPECIMEN STATUS REPORT

## 2024-07-17 LAB — GIARDIA LAMBLIA ANTIBODY, IFA: Giardia Ag, Stl: NEGATIVE

## 2024-08-04 ENCOUNTER — Encounter: Payer: Self-pay | Admitting: Sports Medicine

## 2024-08-11 ENCOUNTER — Ambulatory Visit: Admitting: Sports Medicine

## 2024-08-11 ENCOUNTER — Encounter: Payer: Self-pay | Admitting: Urgent Care

## 2024-08-11 ENCOUNTER — Ambulatory Visit: Admitting: Urgent Care

## 2024-08-11 VITALS — BP 117/70 | HR 52 | Ht 70.0 in | Wt 263.0 lb

## 2024-08-11 DIAGNOSIS — K529 Noninfective gastroenteritis and colitis, unspecified: Secondary | ICD-10-CM

## 2024-08-11 DIAGNOSIS — E119 Type 2 diabetes mellitus without complications: Secondary | ICD-10-CM | POA: Diagnosis not present

## 2024-08-11 MED ORDER — CHOLESTYRAMINE LIGHT 4 G PO PACK: 4.0000 g | PACK | Freq: Two times a day (BID) | ORAL | 3 refills | Status: AC

## 2024-08-11 NOTE — Progress Notes (Signed)
 Established Patient Office Visit  Subjective:  Patient ID: Bobby Saunders, male    DOB: December 08, 1965  Age: 58 y.o. MRN: [id number]  Chief Complaint  Patient presents with   Follow-up    Diarrhea; no better    HPI  Discussed the use of AI scribe software for clinical note transcription with the patient, who gave verbal consent to proceed.  History of Present Illness   Tramar Brueckner is a 58 year old male who presents with chronic diarrhea.  He has been experiencing chronic diarrhea for almost three years, which began immediately after his gallbladder removal. The diarrhea is described as 'pure water' and occurs rapidly after eating, making social activities challenging due to its unpredictability. No associated cramping pain, nausea, vomiting, bloating, or gassiness. If he does not eat, he does not experience diarrhea.  A colonoscopy performed on September 5th revealed two small polyps but was otherwise normal. Previous stool cultures and a lipase test returned normal results. He has been taking Levsin  every four hours without any improvement in symptoms.  He has a history of diabetes, with a recent A1c of 7.6. He had previously stopped taking metformin  but restarted it after seeing his A1c levels. He is currently taking 1000 mg of metformin  twice daily. There has been no change in his diarrhea symptoms with the cessation or resumption of metformin .  He also takes atorvastatin  for cholesterol and pantoprazole  for heartburn. He regularly checks his blood sugar, with a recent reading of 111 mg/dL before eating. He typically eats only once a day and has not experienced any significant weight loss despite his limited eating habits.       Patient Active Problem List   Diagnosis Date Noted   Left second DIP pain 08/22/2023   Spinal stenosis 11/07/2022   Acute cholecystitis 01/02/2022   Cholecystitis, acute 01/01/2022   Transaminitis 11/17/2021   GERD (gastroesophageal reflux  disease) 08/22/2021   Radiculitis of left cervical region 09/09/2020   Left wrist ECU subluxation and TFCC tear 11/05/2019   Skin lesion of scalp 08/27/2019   Right foot pain 08/18/2018   Skin rash 08/18/2018   Vitamin D  insufficiency 01/07/2018   Insomnia 10/11/2017   Controlled type 2 diabetes mellitus without complication, without long-term current use of insulin (HCC) 09/15/2017   Left knee injury 09/13/2017   Chronic diarrhea 11/30/2016   Primary osteoarthritis of left elbow 02/02/2016   Right shoulder pain 03/21/2015   Obstructive apnea 07/09/2014   Hyperlipidemia with target LDL less than 100 12/18/2012   Hypogonadism male 12/18/2012   Annual physical exam 11/03/2012   Low back pain with spinal stenosis 06/02/2012   Past Medical History:  Diagnosis Date   Arthritis    Diabetes mellitus without complication (HCC)    type II   GERD (gastroesophageal reflux disease)    Sleep apnea    cpap 6 yrs   Vitamin D  insufficiency 01/07/2018   Past Surgical History:  Procedure Laterality Date   ANTERIOR LAT LUMBAR FUSION Left 11/07/2022   Procedure: LEFT-SIDED LUMBAR THREE- LUMBAR FOUR LATERAL INTERBODY FUSION WITH INSTRUMENTATION AND ALLOGRAFT.;  Surgeon: Beuford Anes, MD;  Location: [facility] OR;  Service: Orthopedics;  Laterality: Left;   CHOLECYSTECTOMY N/A 01/03/2022   Procedure: LAPAROSCOPIC CHOLECYSTECTOMY;  Surgeon: Lyndel Deward PARAS, MD;  Location: [facility] ORS;  Service: General;  Laterality: N/A;   ELBOW SURGERY Bilateral 01 last   numerous lft , one right   FRACTURE SURGERY     HERNIA REPAIR  LUMBAR LAMINECTOMY/DECOMPRESSION MICRODISCECTOMY N/A 05/13/2014   Procedure: LUMBAR LAMINECTOMY/DECOMPRESSION MICRODISCECTOMY;  Surgeon: Oneil Rodgers Priestly, MD;  Location: [facility] OR;  Service: Orthopedics;  Laterality: N/A;  Lumbar 3-4 decompression   SPINE SURGERY     Social History   Tobacco Use   Smoking status: Former    Current packs/day: 0.00    Average packs/day: 1 pack/day  for 25.0 years (25.0 ttl pk-yrs)    Types: Cigarettes    Start date: 05/05/1980    Quit date: 05/05/2005    Years since quitting: 19.2   Smokeless tobacco: Former    Quit date: 2021  Vaping Use   Vaping status: Never Used  Substance Use Topics   Alcohol use: No    Alcohol/week: 0.0 standard drinks of alcohol   Drug use: No      ROS: as noted in HPI  Objective:     BP 117/70   Pulse (!) 52   Ht 5' 10 (1.778 m)   Wt 263 lb (119.3 kg)   SpO2 94%   BMI 37.74 kg/m  BP Readings from Last 3 Encounters:  08/11/24 117/70  07/09/24 114/68  01/01/24 (!) 146/82   Wt Readings from Last 3 Encounters:  08/11/24 263 lb (119.3 kg)  07/09/24 259 lb (117.5 kg)  11/07/22 270 lb (122.5 kg)      Physical Exam   No results found for any visits on 08/11/24.  Last CBC Lab Results  Component Value Date   WBC 7.9 07/09/2024   HGB 16.8 07/09/2024   HCT 51.0 07/09/2024   MCV 95 07/09/2024   MCH 31.2 07/09/2024   RDW 13.2 07/09/2024   PLT 190 07/09/2024   Last metabolic panel Lab Results  Component Value Date   GLUCOSE 92 07/09/2024   NA 140 07/09/2024   K 4.6 07/09/2024   CL 101 07/09/2024   CO2 21 07/09/2024   BUN 16 07/09/2024   CREATININE 1.08 07/09/2024   EGFR 80 07/09/2024   CALCIUM  9.5 07/09/2024   PROT 7.2 07/09/2024   ALBUMIN  4.7 07/09/2024   LABGLOB 2.5 07/09/2024   BILITOT 0.9 07/09/2024   ALKPHOS 63 07/09/2024   AST 19 07/09/2024   ALT 18 07/09/2024   ANIONGAP 7 11/05/2022   Last lipids Lab Results  Component Value Date   CHOL 199 07/09/2024   HDL 47 07/09/2024   LDLCALC 139 (H) 07/09/2024   TRIG 73 07/09/2024   CHOLHDL 4.2 07/09/2024   Last hemoglobin A1c Lab Results  Component Value Date   HGBA1C 7.6 (H) 07/09/2024      The 10-year ASCVD risk score (Arnett DK, et al., 2019) is: 12.2%  Assessment & Plan:  Chronic diarrhea -     Cholestyramine  Light; Take 1 packet (4 g total) by mouth 2 (two) times daily.  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 Levsin  ineffective, non-IBS etiology suspected. - Prescribe cholestyramine  light powder twice daily. Titrate up to four times daily if needed, monitor for constipation. - Report response to cholestyramine  via MyChart within a week. Consider further evaluation if no improvement.  Type 2 diabetes mellitus Type 2 diabetes mellitus with A1c of 7.6%, managed with metformin . - Continue metformin  1000 mg twice daily. - Recheck A1c after November 7th, 2025. Schedule follow-up for mid-November.  Return in about 2 months (around 10/12/2024).   Benton LITTIE Gave, PA

## 2024-08-11 NOTE — Patient Instructions (Addendum)
 Use one packet of cholestyramine  twice daily to help bulken stool.  After 2 weeks if current dose ineffective, may increase to three times daily. Max dose is 4 packets daily.  STOP levsin  as it has been ineffective.  Continue all home medications as ordered. Follow up mid-Nov for diabetes recheck.

## 2024-08-11 NOTE — Progress Notes (Deleted)
 Bobby Saunders

## 2024-08-21 ENCOUNTER — Encounter: Payer: Self-pay | Admitting: Urgent Care

## 2024-08-25 NOTE — Telephone Encounter (Signed)
 Faxed med release - to [facility] [place]

## 2024-08-31 ENCOUNTER — Other Ambulatory Visit: Payer: Self-pay

## 2024-08-31 ENCOUNTER — Telehealth: Payer: Self-pay

## 2024-08-31 NOTE — Telephone Encounter (Signed)
 Rec'd fax from pharmacy for pregabalin  refill. At previous visit, patient reported not taking this medication.   Left message for patient to call back to office to clear up confusion about refill.

## 2024-09-01 NOTE — Telephone Encounter (Signed)
 A second attempt was made to contact Bobby Saunders. Unfortunately, I was also sent to voicemail. I have left a voice message requesting a call back to our office at patients earliest convenience.

## 2024-09-03 NOTE — Telephone Encounter (Signed)
 Attempted call to patient. Left a voice mail message requesting a return call.

## 2024-09-07 NOTE — Telephone Encounter (Signed)
 Again attempted a call to patient. Again left a voice mail message requesting a return call.

## 2024-10-13 ENCOUNTER — Ambulatory Visit: Admitting: Urgent Care

## 2024-10-16 ENCOUNTER — Telehealth: Payer: Self-pay

## 2024-10-16 NOTE — Telephone Encounter (Signed)
 Contacted patient and advised an appointment for diabetic management is due based on last office visit note. Patient stated he will look at his personal calendar and return a call to our office for scheduling.

## 2024-11-02 ENCOUNTER — Other Ambulatory Visit: Payer: Self-pay

## 2024-11-02 MED ORDER — PREGABALIN 75 MG PO CAPS: 75.0000 mg | ORAL_CAPSULE | Freq: Three times a day (TID) | ORAL | 1 refills | Status: AC

## 2024-11-02 NOTE — Telephone Encounter (Signed)
 Message sent via Mychart in Wife's chart  Requesting rx rf of  Pregabalin  75mg  (Pended old prescription) Not showing in current med list  Last written 11/07/2022 Upcoming appt = none

## 2024-11-02 NOTE — Telephone Encounter (Signed)
 I will call in a refill. Pt does need to schedule an appointment in January for A1C recheck please.

## 2024-11-05 NOTE — Telephone Encounter (Signed)
 Last read by Kayla JAYSON Cosme Quita at 10:01PM on 11/03/2024.

## 2024-12-18 ENCOUNTER — Other Ambulatory Visit: Payer: Self-pay

## 2024-12-18 ENCOUNTER — Other Ambulatory Visit: Payer: Self-pay | Admitting: Urgent Care

## 2024-12-18 DIAGNOSIS — K219 Gastro-esophageal reflux disease without esophagitis: Secondary | ICD-10-CM

## 2024-12-18 MED ORDER — PANTOPRAZOLE SODIUM 40 MG PO TBEC: 40.0000 mg | DELAYED_RELEASE_TABLET | Freq: Every day | ORAL | 0 refills | Status: AC
